# Patient Record
Sex: Female | Born: 1980 | Race: White | Hispanic: No | Marital: Married | Smoking: Never smoker
Health system: Southern US, Community
[De-identification: ages and names within clinical notes are randomized; demographics above are authoritative.]

## PROBLEM LIST (undated history)

## (undated) DIAGNOSIS — F419 Anxiety disorder, unspecified: Secondary | ICD-10-CM

## (undated) DIAGNOSIS — Z789 Other specified health status: Secondary | ICD-10-CM

## (undated) HISTORY — PX: WISDOM TOOTH EXTRACTION: SHX21

## (undated) HISTORY — DX: Anxiety disorder, unspecified: F41.9

## (undated) HISTORY — PX: NO PAST SURGERIES: SHX2092

---

## 2007-09-04 ENCOUNTER — Other Ambulatory Visit: Admission: RE | Admit: 2007-09-04 | Discharge: 2007-09-04 | Payer: Self-pay | Admitting: Obstetrics and Gynecology

## 2008-09-21 ENCOUNTER — Ambulatory Visit (HOSPITAL_COMMUNITY): Admission: RE | Admit: 2008-09-21 | Discharge: 2008-09-21 | Payer: Self-pay | Admitting: Gynecology

## 2009-07-08 ENCOUNTER — Inpatient Hospital Stay (HOSPITAL_COMMUNITY): Admission: AD | Admit: 2009-07-08 | Discharge: 2009-07-08 | Payer: Self-pay | Admitting: Obstetrics and Gynecology

## 2009-08-25 ENCOUNTER — Inpatient Hospital Stay (HOSPITAL_COMMUNITY): Admission: AD | Admit: 2009-08-25 | Discharge: 2009-08-27 | Payer: Self-pay | Admitting: Obstetrics and Gynecology

## 2010-08-01 LAB — CBC
HCT: 27.5 % — ABNORMAL LOW (ref 36.0–46.0)
MCHC: 34.8 g/dL (ref 30.0–36.0)
Platelets: 161 10*3/uL (ref 150–400)

## 2010-08-02 LAB — CBC
HCT: 36.6 % (ref 36.0–46.0)
MCV: 89.1 fL (ref 78.0–100.0)
RBC: 4.11 MIL/uL (ref 3.87–5.11)
WBC: 11.1 10*3/uL — ABNORMAL HIGH (ref 4.0–10.5)

## 2010-08-02 LAB — RPR: RPR Ser Ql: NONREACTIVE

## 2010-08-02 LAB — URINALYSIS, ROUTINE W REFLEX MICROSCOPIC: Bilirubin Urine: NEGATIVE

## 2010-08-02 LAB — URINE MICROSCOPIC-ADD ON

## 2010-09-28 ENCOUNTER — Other Ambulatory Visit (HOSPITAL_COMMUNITY)
Admission: RE | Admit: 2010-09-28 | Discharge: 2010-09-28 | Disposition: A | Discharge status: Home / Self Care | Payer: BC Managed Care – PPO | Source: Ambulatory Visit | Attending: Obstetrics & Gynecology | Admitting: Obstetrics & Gynecology

## 2010-09-28 ENCOUNTER — Other Ambulatory Visit: Payer: Self-pay | Admitting: Obstetrics & Gynecology

## 2010-09-28 DIAGNOSIS — Z01419 Encounter for gynecological examination (general) (routine) without abnormal findings: Secondary | ICD-10-CM | POA: Insufficient documentation

## 2012-01-22 LAB — OB RESULTS CONSOLE HIV ANTIBODY (ROUTINE TESTING): HIV: NONREACTIVE

## 2012-01-22 LAB — OB RESULTS CONSOLE GC/CHLAMYDIA
Chlamydia: NEGATIVE
Gonorrhea: NEGATIVE

## 2012-05-14 NOTE — L&D Delivery Note (Signed)
SVD of VMI at 1152 on 08/27/12 wt and cord pH pending.  EBL 250cc.  APGARs 9,9. Head delivered ROA with mouth and nose bulb suctioned.  Body delivered atraumatically.  Cord clamped, cut and baby to abdomen.  Cord pH obtained.  Placenta delivered S/I/3VC.  Fundus firmed with pitocin and massage.  2nd degree perineal laceration repaired with 3-0 Rapide in the normal fashion.  Mom and baby stable.

## 2012-07-28 LAB — OB RESULTS CONSOLE GBS: GBS: NEGATIVE

## 2012-08-27 ENCOUNTER — Encounter (HOSPITAL_COMMUNITY): Payer: Self-pay

## 2012-08-27 ENCOUNTER — Inpatient Hospital Stay (HOSPITAL_COMMUNITY)
Admission: RE | Admit: 2012-08-27 | Discharge: 2012-08-28 | DRG: 373 | Disposition: A | Discharge status: Home / Self Care | Payer: BC Managed Care – PPO | Source: Ambulatory Visit | Attending: Obstetrics & Gynecology | Admitting: Obstetrics & Gynecology

## 2012-08-27 ENCOUNTER — Inpatient Hospital Stay (HOSPITAL_COMMUNITY): Payer: BC Managed Care – PPO | Admitting: Anesthesiology

## 2012-08-27 ENCOUNTER — Encounter (HOSPITAL_COMMUNITY): Payer: Self-pay | Admitting: Anesthesiology

## 2012-08-27 HISTORY — DX: Other specified health status: Z78.9

## 2012-08-27 LAB — CBC
Hemoglobin: 9.8 g/dL — ABNORMAL LOW (ref 12.0–15.0)
MCH: 24.7 pg — ABNORMAL LOW (ref 26.0–34.0)
MCHC: 32.2 g/dL (ref 30.0–36.0)
Platelets: 201 10*3/uL (ref 150–400)
RDW: 14.2 % (ref 11.5–15.5)

## 2012-08-27 LAB — RPR: RPR Ser Ql: NONREACTIVE

## 2012-08-27 MED ORDER — SIMETHICONE 80 MG PO CHEW: 80.0000 mg | CHEWABLE_TABLET | ORAL | Status: DC | PRN

## 2012-08-27 MED ORDER — OXYTOCIN BOLUS FROM INFUSION: 500.0000 mL | INTRAVENOUS | Status: DC

## 2012-08-27 MED ORDER — PRENATAL MULTIVITAMIN CH
1.0000 | ORAL_TABLET | Freq: Every day | ORAL | Status: DC
  Administered 2012-08-27: 1 via ORAL
  Filled 2012-08-27 (×2): qty 1

## 2012-08-27 MED ORDER — LACTATED RINGERS IV SOLN: 500.0000 mL | INTRAVENOUS | Status: DC | PRN

## 2012-08-27 MED ORDER — SENNOSIDES-DOCUSATE SODIUM 8.6-50 MG PO TABS
2.0000 | ORAL_TABLET | Freq: Every day | ORAL | Status: DC
  Administered 2012-08-27: 2 via ORAL

## 2012-08-27 MED ORDER — ZOLPIDEM TARTRATE 5 MG PO TABS: 5.0000 mg | ORAL_TABLET | Freq: Every evening | ORAL | Status: DC | PRN

## 2012-08-27 MED ORDER — LANOLIN HYDROUS EX OINT: TOPICAL_OINTMENT | CUTANEOUS | Status: DC | PRN

## 2012-08-27 MED ORDER — DIPHENHYDRAMINE HCL 50 MG/ML IJ SOLN: 12.5000 mg | INTRAMUSCULAR | Status: DC | PRN

## 2012-08-27 MED ORDER — ONDANSETRON HCL 4 MG PO TABS: 4.0000 mg | ORAL_TABLET | ORAL | Status: DC | PRN

## 2012-08-27 MED ORDER — LIDOCAINE HCL (PF) 1 % IJ SOLN
INTRAMUSCULAR | Status: DC | PRN
  Administered 2012-08-27 (×2): 4 mL

## 2012-08-27 MED ORDER — FENTANYL 2.5 MCG/ML BUPIVACAINE 1/10 % EPIDURAL INFUSION (WH - ANES)
14.0000 mL/h | INTRAMUSCULAR | Status: DC | PRN
  Filled 2012-08-27: qty 125

## 2012-08-27 MED ORDER — DIPHENHYDRAMINE HCL 25 MG PO CAPS: 25.0000 mg | ORAL_CAPSULE | Freq: Four times a day (QID) | ORAL | Status: DC | PRN

## 2012-08-27 MED ORDER — CITRIC ACID-SODIUM CITRATE 334-500 MG/5ML PO SOLN: 30.0000 mL | ORAL | Status: DC | PRN

## 2012-08-27 MED ORDER — DIBUCAINE 1 % RE OINT
1.0000 "application " | TOPICAL_OINTMENT | RECTAL | Status: DC | PRN
  Filled 2012-08-27: qty 28

## 2012-08-27 MED ORDER — EPHEDRINE 5 MG/ML INJ
10.0000 mg | INTRAVENOUS | Status: DC | PRN
  Filled 2012-08-27: qty 2

## 2012-08-27 MED ORDER — OXYCODONE-ACETAMINOPHEN 5-325 MG PO TABS: 1.0000 | ORAL_TABLET | ORAL | Status: DC | PRN

## 2012-08-27 MED ORDER — FLEET ENEMA 7-19 GM/118ML RE ENEM: 1.0000 | ENEMA | RECTAL | Status: DC | PRN

## 2012-08-27 MED ORDER — ACETAMINOPHEN 325 MG PO TABS: 650.0000 mg | ORAL_TABLET | ORAL | Status: DC | PRN

## 2012-08-27 MED ORDER — WITCH HAZEL-GLYCERIN EX PADS: 1.0000 "application " | MEDICATED_PAD | CUTANEOUS | Status: DC | PRN

## 2012-08-27 MED ORDER — FENTANYL 2.5 MCG/ML BUPIVACAINE 1/10 % EPIDURAL INFUSION (WH - ANES)
INTRAMUSCULAR | Status: DC | PRN
  Administered 2012-08-27: 14 mL/h via EPIDURAL

## 2012-08-27 MED ORDER — PHENYLEPHRINE 40 MCG/ML (10ML) SYRINGE FOR IV PUSH (FOR BLOOD PRESSURE SUPPORT)
80.0000 ug | PREFILLED_SYRINGE | INTRAVENOUS | Status: DC | PRN
  Filled 2012-08-27: qty 2
  Filled 2012-08-27: qty 5

## 2012-08-27 MED ORDER — EPHEDRINE 5 MG/ML INJ
10.0000 mg | INTRAVENOUS | Status: DC | PRN
  Filled 2012-08-27: qty 2
  Filled 2012-08-27: qty 4

## 2012-08-27 MED ORDER — BENZOCAINE-MENTHOL 20-0.5 % EX AERO
1.0000 "application " | INHALATION_SPRAY | CUTANEOUS | Status: DC | PRN
  Administered 2012-08-28: 1 via TOPICAL
  Filled 2012-08-27 (×2): qty 56

## 2012-08-27 MED ORDER — PHENYLEPHRINE 40 MCG/ML (10ML) SYRINGE FOR IV PUSH (FOR BLOOD PRESSURE SUPPORT)
80.0000 ug | PREFILLED_SYRINGE | INTRAVENOUS | Status: DC | PRN
  Filled 2012-08-27: qty 2

## 2012-08-27 MED ORDER — ONDANSETRON HCL 4 MG/2ML IJ SOLN: 4.0000 mg | Freq: Four times a day (QID) | INTRAMUSCULAR | Status: DC | PRN

## 2012-08-27 MED ORDER — OXYTOCIN 40 UNITS IN LACTATED RINGERS INFUSION - SIMPLE MED: 62.5000 mL/h | INTRAVENOUS | Status: DC

## 2012-08-27 MED ORDER — IBUPROFEN 600 MG PO TABS
600.0000 mg | ORAL_TABLET | Freq: Four times a day (QID) | ORAL | Status: DC
  Administered 2012-08-27 – 2012-08-28 (×4): 600 mg via ORAL
  Filled 2012-08-27 (×3): qty 1

## 2012-08-27 MED ORDER — LIDOCAINE HCL (PF) 1 % IJ SOLN
30.0000 mL | INTRAMUSCULAR | Status: DC | PRN
  Filled 2012-08-27: qty 30

## 2012-08-27 MED ORDER — TERBUTALINE SULFATE 1 MG/ML IJ SOLN: 0.2500 mg | Freq: Once | INTRAMUSCULAR | Status: DC | PRN

## 2012-08-27 MED ORDER — OXYCODONE-ACETAMINOPHEN 5-325 MG PO TABS
1.0000 | ORAL_TABLET | ORAL | Status: DC | PRN
  Administered 2012-08-28: 1 via ORAL
  Filled 2012-08-27: qty 1

## 2012-08-27 MED ORDER — LACTATED RINGERS IV SOLN
INTRAVENOUS | Status: DC
  Administered 2012-08-27 (×2): via INTRAVENOUS

## 2012-08-27 MED ORDER — ONDANSETRON HCL 4 MG/2ML IJ SOLN: 4.0000 mg | INTRAMUSCULAR | Status: DC | PRN

## 2012-08-27 MED ORDER — OXYTOCIN 40 UNITS IN LACTATED RINGERS INFUSION - SIMPLE MED
1.0000 m[IU]/min | INTRAVENOUS | Status: DC
  Administered 2012-08-27: 2 m[IU]/min via INTRAVENOUS
  Filled 2012-08-27: qty 1000

## 2012-08-27 MED ORDER — IBUPROFEN 600 MG PO TABS
600.0000 mg | ORAL_TABLET | Freq: Four times a day (QID) | ORAL | Status: DC | PRN
  Filled 2012-08-27: qty 1

## 2012-08-27 MED ORDER — TETANUS-DIPHTH-ACELL PERTUSSIS 5-2.5-18.5 LF-MCG/0.5 IM SUSP
0.5000 mL | Freq: Once | INTRAMUSCULAR | Status: DC
  Filled 2012-08-27: qty 0.5

## 2012-08-27 MED ORDER — LACTATED RINGERS IV SOLN
500.0000 mL | Freq: Once | INTRAVENOUS | Status: AC
  Administered 2012-08-27: 500 mL via INTRAVENOUS

## 2012-08-27 NOTE — H&P (Signed)
Vanessa Bryant is a 32 y.o. female presenting for induction of labor.  GBS negative.  No antepartum complications. Maternal Medical History:  Contractions: Onset was 1 week ago.   Frequency: rare.   Perceived severity is mild.    Fetal activity: Perceived fetal activity is normal.   Last perceived fetal movement was within the past hour.    Prenatal complications: no prenatal complications Prenatal Complications - Diabetes: none.    OB History   Grav Para Term Preterm Abortions TAB SAB Ect Mult Living   2 1 1  0 0 0 0   1     Past Medical History  Diagnosis Date  . Medical history non-contributory    Past Surgical History  Procedure Laterality Date  . No past surgeries     Family History: family history includes Diabetes in her maternal grandfather and maternal grandmother; Heart disease in her mother; and Hypertension in her father and mother. Social History:  reports that she has never smoked. She has never used smokeless tobacco. She reports that she does not drink alcohol or use illicit drugs.   Prenatal Transfer Tool  Maternal Diabetes: No Genetic Screening: Normal Maternal Ultrasounds/Referrals: Normal Fetal Ultrasounds or other Referrals:  None Maternal Substance Abuse:  No Significant Maternal Medications:  None Significant Maternal Lab Results:  None Other Comments:  None  ROS    Blood pressure 117/80, pulse 70, temperature 98.1 F (36.7 C), temperature source Oral, resp. rate 18, height 5\' 2"  (1.575 m), weight 150 lb (68.04 kg). Maternal Exam:  Uterine Assessment: Contraction strength is mild.  Contraction frequency is rare.   Abdomen: Patient reports no abdominal tenderness. Fundal height is c/w dates.   Estimated fetal weight is 7#8.   Fetal presentation: vertex  Introitus: Normal vulva. Normal vagina.  Amniotic fluid character: clear.  Pelvis: adequate for delivery.   Cervix: Cervix evaluated by digital exam.     Physical Exam  Constitutional:  She is oriented to person, place, and time. She appears well-developed and well-nourished.  GI: Soft. There is no rebound and no guarding.  Genitourinary: Vagina normal and uterus normal.  Neurological: She is alert and oriented to person, place, and time.  Skin: Skin is warm and dry.  Psychiatric: She has a normal mood and affect. Her behavior is normal.    Prenatal labs: ABO, Rh: O/Positive/-- (09/10 0000) Antibody: Negative (09/10 0000) Rubella: Immune (09/10 0000) RPR: Nonreactive (09/10 0000)  HBsAg: Negative (09/10 0000)  HIV: Non-reactive (09/10 0000)  GBS: Negative (03/17 0000)   Assessment/Plan: 31yo G2P1001 at [redacted]w[redacted]d for IOL -AROM with pitocin as needed -Epidural if desired -Anticipate SVD   Vinisha Faxon 08/27/2012, 7:37 AM

## 2012-08-27 NOTE — Anesthesia Procedure Notes (Signed)
Epidural Patient location during procedure: OB Start time: 08/27/2012 8:59 AM  Staffing Anesthesiologist: Janea Schwenn A. Performed by: anesthesiologist   Preanesthetic Checklist Completed: patient identified, site marked, surgical consent, pre-op evaluation, timeout performed, IV checked, risks and benefits discussed and monitors and equipment checked  Epidural Patient position: sitting Prep: site prepped and draped and DuraPrep Patient monitoring: continuous pulse ox and blood pressure Approach: midline Injection technique: LOR air  Needle:  Needle type: Tuohy  Needle gauge: 17 G Needle length: 9 cm and 9 Needle insertion depth: 4 cm Catheter type: closed end flexible Catheter size: 19 Gauge Catheter at skin depth: 9 cm Test dose: negative and Other  Assessment Events: blood not aspirated, injection not painful, no injection resistance, negative IV test and no paresthesia  Additional Notes Patient identified. Risks and benefits discussed including failed block, incomplete  Pain control, post dural puncture headache, nerve damage, paralysis, blood pressure Changes, nausea, vomiting, reactions to medications-both toxic and allergic and post Partum back pain. All questions were answered. Patient expressed understanding and wished to proceed. Sterile technique was used throughout procedure. Epidural site was Dressed with sterile barrier dressing. No paresthesias, signs of intravascular injection Or signs of intrathecal spread were encountered.  Patient was more comfortable after the epidural was dosed. Please see RN's note for documentation of vital signs and FHR which are stable.

## 2012-08-27 NOTE — Anesthesia Preprocedure Evaluation (Signed)

## 2012-08-28 LAB — CBC
Platelets: 172 10*3/uL (ref 150–400)
RBC: 3.71 MIL/uL — ABNORMAL LOW (ref 3.87–5.11)
RDW: 14.3 % (ref 11.5–15.5)
WBC: 12.2 10*3/uL — ABNORMAL HIGH (ref 4.0–10.5)

## 2012-08-28 MED ORDER — IBUPROFEN 600 MG PO TABS: 600.0000 mg | ORAL_TABLET | Freq: Four times a day (QID) | ORAL | Status: DC

## 2012-08-28 MED ORDER — OXYCODONE-ACETAMINOPHEN 5-325 MG PO TABS: 1.0000 | ORAL_TABLET | ORAL | Status: DC | PRN

## 2012-08-28 NOTE — Anesthesia Postprocedure Evaluation (Signed)
  Anesthesia Post-op Note  Patient: Vanessa Bryant  Procedure(s) Performed: * No procedures listed *  Patient Location: Mother/Baby  Anesthesia Type:Epidural  Level of Consciousness: awake  Airway and Oxygen Therapy: Patient Spontanous Breathing  Post-op Pain: none  Post-op Assessment: Patient's Cardiovascular Status Stable, Respiratory Function Stable, Patent Airway, No signs of Nausea or vomiting, Adequate PO intake, Pain level controlled, No headache, No backache, No residual numbness and No residual motor weakness  Post-op Vital Signs: Reviewed and stable  Complications: No apparent anesthesia complications

## 2012-08-28 NOTE — Progress Notes (Signed)
UR chart review completed.  

## 2012-08-28 NOTE — Progress Notes (Signed)
Post Partum Day 1 Subjective: no complaints, up ad lib, voiding, tolerating PO and + flatus  Objective: Blood pressure 126/81, pulse 65, temperature 97.6 F (36.4 C), temperature source Oral, resp. rate 18, height 5\' 2"  (1.575 m), weight 150 lb (68.04 kg), SpO2 99.00%, unknown if currently breastfeeding.  Physical Exam:  General: alert and cooperative Lochia: appropriate Uterine Fundus: firm Incision: perineum intact, small labial edema DVT Evaluation: No evidence of DVT seen on physical exam. Negative Homan's sign. No cords or calf tenderness. No significant calf/ankle edema.   Recent Labs  08/27/12 0700 08/28/12 0644  HGB 9.8* 9.0*  HCT 30.4* 28.7*    Assessment/Plan: Discharge home and Circumcision prior to discharge   LOS: 1 day   Kenneth Cuaresma G 08/28/2012, 8:01 AM

## 2012-08-28 NOTE — Discharge Summary (Signed)
Obstetric Discharge Summary Reason for Admission: induction of labor Prenatal Procedures: ultrasound Intrapartum Procedures: spontaneous vaginal delivery Postpartum Procedures: none Complications-Operative and Postpartum: 2 degree perineal laceration Hemoglobin  Date Value Range Status  08/28/2012 9.0* 12.0 - 15.0 g/dL Final     HCT  Date Value Range Status  08/28/2012 28.7* 36.0 - 46.0 % Final    Physical Exam:  General: alert and cooperative Lochia: appropriate Uterine Fundus: firm Incision: perineum intact, small labial edema DVT Evaluation: No evidence of DVT seen on physical exam. Negative Homan's sign. No cords or calf tenderness. No significant calf/ankle edema.  Discharge Diagnoses: Term Pregnancy-delivered  Discharge Information: Date: 08/28/2012 Activity: pelvic rest Diet: routine Medications: PNV, Ibuprofen and Percocet Condition: stable Instructions: refer to practice specific booklet Discharge to: home   Newborn Data: Live born female  Birth Weight: 7 lb 13.9 oz (3569 g) APGAR: 9, 9  Home with mother.  CURTIS,CAROL G 08/28/2012, 8:16 AM

## 2012-08-29 ENCOUNTER — Inpatient Hospital Stay (HOSPITAL_COMMUNITY): Admission: AD | Admit: 2012-08-29 | Payer: Self-pay | Source: Ambulatory Visit | Admitting: Obstetrics and Gynecology

## 2012-11-30 ENCOUNTER — Emergency Department (INDEPENDENT_AMBULATORY_CARE_PROVIDER_SITE_OTHER)
Admission: EM | Admit: 2012-11-30 | Discharge: 2012-11-30 | Disposition: A | Discharge status: Home / Self Care | Payer: BC Managed Care – PPO | Source: Home / Self Care

## 2012-11-30 ENCOUNTER — Encounter (HOSPITAL_COMMUNITY): Payer: Self-pay

## 2012-11-30 DIAGNOSIS — R109 Unspecified abdominal pain: Secondary | ICD-10-CM

## 2012-11-30 LAB — POCT URINALYSIS DIP (DEVICE)
Bilirubin Urine: NEGATIVE
Glucose, UA: NEGATIVE mg/dL
Hgb urine dipstick: NEGATIVE
Leukocytes, UA: NEGATIVE
Nitrite: NEGATIVE
Urobilinogen, UA: 0.2 mg/dL (ref 0.0–1.0)

## 2012-11-30 LAB — POCT PREGNANCY, URINE: Preg Test, Ur: NEGATIVE

## 2012-11-30 NOTE — ED Notes (Signed)
Right side flank pain 

## 2012-11-30 NOTE — ED Provider Notes (Signed)
CANDI PROFIT is a 32 y.o. female who presents to Urgent Care today for right flank pain. Patient has had intermittent episodes of acutely intense right flank pain for the last 13 weeks. She delivered a healthy baby 13 weeks ago and a few days after the delivery she had one episode of right flank pain that lasted several hours. Since then she has had several episodes of moderate intensity short duration right flank pain. She had a new episode of right flank pain last night. This was moderate to severely intense and lasted several hours. It did not radiate nor was it associated with vomiting or diarrhea. The pain resolved last night and has not recurred.  She is currently pain-free. She denies any fevers or chills and feels well otherwise. She took ibuprofen for the pain which worked reasonably well. She denies any blood in her urine.   She is currently breast-feeding and allergic to penicillin.    PMH reviewed. As above History  Substance Use Topics  . Smoking status: Never Smoker   . Smokeless tobacco: Never Used  . Alcohol Use: No   ROS as above Medications reviewed. No current facility-administered medications for this encounter.   Current Outpatient Prescriptions  Medication Sig Dispense Refill  . ibuprofen (ADVIL,MOTRIN) 600 MG tablet Take 1 tablet (600 mg total) by mouth every 6 (six) hours.  30 tablet  1  . Prenatal Vit-Fe Fumarate-FA (PRENATAL MULTIVITAMIN) TABS Take 1 tablet by mouth daily at 12 noon.        Exam:  BP 148/87  Pulse 70  Temp(Src) 98.1 F (36.7 C) (Oral)  Resp 16  SpO2 99% Gen: Well NAD HEENT: EOMI,  MMM Lungs: CTABL Nl WOB Heart: RRR no MRG Abd: NABS, NT, ND,  No costovertebral angle tenderness to percussion.  Exts: Non edematous BL  LE, warm and well perfused.   Results for orders placed during the hospital encounter of 11/30/12 (from the past 24 hour(s))  POCT URINALYSIS DIP (DEVICE)     Status: Abnormal   Collection Time    11/30/12 11:28 AM   Result Value Range   Glucose, UA NEGATIVE  NEGATIVE mg/dL   Bilirubin Urine NEGATIVE  NEGATIVE   Ketones, ur NEGATIVE  NEGATIVE mg/dL   Specific Gravity, Urine 1.020  1.005 - 1.030   Hgb urine dipstick NEGATIVE  NEGATIVE   pH 7.5  5.0 - 8.0   Protein, ur 30 (*) NEGATIVE mg/dL   Urobilinogen, UA 0.2  0.0 - 1.0 mg/dL   Nitrite NEGATIVE  NEGATIVE   Leukocytes, UA NEGATIVE  NEGATIVE  POCT PREGNANCY, URINE     Status: None   Collection Time    11/30/12 11:30 AM      Result Value Range   Preg Test, Ur NEGATIVE  NEGATIVE   No results found.  Assessment and Plan: 32 y.o. female with right flank pain.  Symptoms consistent with passed kidney stone.  Urine only shows small amount of protein.  Feel it is reasonable for her to followup with her primary care Dr. to work this up further.  I feel that a OB/GYN ultrasound would be reasonable.  Discussed precautions with patient who expresses understand.  Discussed warning signs or symptoms. Please see discharge instructions.     Rodolph Bong, MD 11/30/12 [phone]

## 2012-12-02 ENCOUNTER — Ambulatory Visit (INDEPENDENT_AMBULATORY_CARE_PROVIDER_SITE_OTHER): Payer: BC Managed Care – PPO | Admitting: Obstetrics & Gynecology

## 2012-12-02 ENCOUNTER — Encounter: Payer: Self-pay | Admitting: Obstetrics & Gynecology

## 2012-12-02 VITALS — BP 132/80 | Ht 62.0 in | Wt 119.8 lb

## 2012-12-02 DIAGNOSIS — N201 Calculus of ureter: Secondary | ICD-10-CM | POA: Insufficient documentation

## 2012-12-02 MED ORDER — PROMETHAZINE HCL 25 MG PO TABS: 25.0000 mg | ORAL_TABLET | Freq: Four times a day (QID) | ORAL | Status: DC | PRN

## 2012-12-02 NOTE — Progress Notes (Signed)
Patient ID: Vanessa Bryant, female   DOB: 11-02-1980, 32 y.o.   MRN: [id number] Patient had clinically the passage of a kidney stone last Saturday night Feels fine now Recommended to drink 8 oz unsweetened black cherry juice a day and to use filtered/bottled water  No reason to do scan at this point Pt agrees

## 2012-12-02 NOTE — Patient Instructions (Addendum)

## 2012-12-16 ENCOUNTER — Telehealth: Payer: Self-pay | Admitting: Obstetrics & Gynecology

## 2012-12-16 DIAGNOSIS — N201 Calculus of ureter: Secondary | ICD-10-CM

## 2012-12-16 NOTE — Telephone Encounter (Signed)
Pt informed CT scan ordered, pt to call [phone] to schedule time at her convenience. Pt verbalized understanding.

## 2012-12-16 NOTE — Telephone Encounter (Signed)
Pt states up last night due to right mid-back pain, took ibuprofen and oxycodone with some improvement. Pt states saw Dr. Despina Hidden x 2 weeks ago and would like to proceed with a scan to see "what is going on." States questionable kidney stone.

## 2012-12-19 ENCOUNTER — Ambulatory Visit (HOSPITAL_COMMUNITY)
Admission: RE | Admit: 2012-12-19 | Discharge: 2012-12-19 | Disposition: A | Discharge status: Home / Self Care | Payer: BC Managed Care – PPO | Source: Ambulatory Visit | Attending: Obstetrics & Gynecology | Admitting: Obstetrics & Gynecology

## 2012-12-19 DIAGNOSIS — K8020 Calculus of gallbladder without cholecystitis without obstruction: Secondary | ICD-10-CM | POA: Insufficient documentation

## 2012-12-19 DIAGNOSIS — R1031 Right lower quadrant pain: Secondary | ICD-10-CM | POA: Insufficient documentation

## 2012-12-19 DIAGNOSIS — N201 Calculus of ureter: Secondary | ICD-10-CM

## 2012-12-19 NOTE — Discharge Instructions (Signed)
Neg POCT pregnancy test done by Chapman Moss, RN  Done per Dr Rayna Sexton orders via phone order/ [facility]

## 2013-01-13 ENCOUNTER — Telehealth: Payer: Self-pay | Admitting: Obstetrics & Gynecology

## 2013-01-13 ENCOUNTER — Other Ambulatory Visit: Payer: Self-pay | Admitting: *Deleted

## 2013-01-13 DIAGNOSIS — K8020 Calculus of gallbladder without cholecystitis without obstruction: Secondary | ICD-10-CM

## 2013-01-13 NOTE — Telephone Encounter (Signed)
Please call and set up referral for pt to the listed physician

## 2013-01-13 NOTE — Telephone Encounter (Signed)
Appt made with Avel Peace, [facility] surgery per Dr. Despina Hidden. Pt aware.

## 2013-01-13 NOTE — Telephone Encounter (Signed)
Pt continues to have gallbladder issues would like referral to [facility], Dr. Avel Peace.

## 2013-01-22 ENCOUNTER — Encounter (INDEPENDENT_AMBULATORY_CARE_PROVIDER_SITE_OTHER): Payer: Self-pay | Admitting: General Surgery

## 2013-01-22 ENCOUNTER — Ambulatory Visit (INDEPENDENT_AMBULATORY_CARE_PROVIDER_SITE_OTHER): Payer: BC Managed Care – PPO | Admitting: General Surgery

## 2013-01-22 VITALS — BP 100/60 | HR 64 | Resp 16 | Ht 62.0 in | Wt 116.4 lb

## 2013-01-22 DIAGNOSIS — K8020 Calculus of gallbladder without cholecystitis without obstruction: Secondary | ICD-10-CM

## 2013-01-22 NOTE — Progress Notes (Signed)
Patient ID: Vanessa Bryant, female   DOB: [birth year], 32 y.o.   MRN: [id number]  No chief complaint on file.   HPI Vanessa Bryant is a 32 y.o. female.  The patient is a 32 year old female has been diagnosed with gallstones. The patient states she began having right upper quadrant pain approximately 5 months ago after the birth of her second child. She states that all her pain is becoming more frequent now to approximately once a week. She has been staying along the fatty foods, but continues with pain. She does feel that the amount of food she's also contributes to the pain.  HPI  Past Medical History  Diagnosis Date  . Medical history non-contributory     Past Surgical History  Procedure Laterality Date  . No past surgeries      Family History  Problem Relation Age of Onset  . Heart disease Mother   . Hypertension Mother   . Hypertension Father   . Diabetes Maternal Grandmother   . Diabetes Maternal Grandfather     Social History History  Substance Use Topics  . Smoking status: Never Smoker   . Smokeless tobacco: Never Used  . Alcohol Use: No    Allergies  Allergen Reactions  . Penicillins Rash    As a child     Current Outpatient Prescriptions  Medication Sig Dispense Refill  . HEATHER 0.35 MG tablet Take 1 tablet by mouth daily.       Marland Kitchen ibuprofen (ADVIL,MOTRIN) 600 MG tablet Take 1 tablet (600 mg total) by mouth every 6 (six) hours.  30 tablet  1  . oxyCODONE-acetaminophen (PERCOCET/ROXICET) 5-325 MG per tablet Take 1 tablet by mouth every 4 (four) hours as needed for pain.      . Prenatal Vit-Fe Fumarate-FA (PRENATAL MULTIVITAMIN) TABS Take 1 tablet by mouth daily at 12 noon.      . promethazine (PHENERGAN) 25 MG tablet Take 1 tablet (25 mg total) by mouth every 6 (six) hours as needed for nausea.  30 tablet  1   No current facility-administered medications for this visit.    Review of Systems Review of Systems  HENT: Negative.   Respiratory: Negative.    Cardiovascular: Negative.   Gastrointestinal: Positive for abdominal pain.  Musculoskeletal: Negative.   Neurological: Negative.   All other systems reviewed and are negative.    Blood pressure 100/60, pulse 64, resp. rate 16, height 5\' 2"  (1.575 m), weight 116 lb 6.4 oz (52.799 kg).  Physical Exam Physical Exam  Constitutional: She is oriented to person, place, and time. She appears well-developed.  HENT:  Head: Normocephalic and atraumatic.  Eyes: Conjunctivae and EOM are normal. Pupils are equal, round, and reactive to light.  Neck: Normal range of motion. Neck supple.  Cardiovascular: Normal rate, regular rhythm and normal heart sounds.   Pulmonary/Chest: Effort normal and breath sounds normal.  Abdominal: Soft. Bowel sounds are normal. She exhibits no distension. There is tenderness (min to RUQ). There is no rebound and no guarding.  Musculoskeletal: Normal range of motion.  Neurological: She is alert and oriented to person, place, and time.  Skin: Skin is warm and dry.    Data Reviewed CT scan reveals large gallstone within the gallbladder.  Assessment    32 year old female with symptomatic cholelithiasis     Plan     1. We will proceed to the operating room for laparoscopic cholecystectomy with IOC.  2. All risks and benefits were discussed with the patient to  generally include: infection, bleeding, possible need for post op ERCP, damage to the bile ducts, and bile leak. Alternatives were offered and described.  All questions were answered and the patient voiced understanding of the procedure and wishes to proceed at this point with a laparoscopic cholecystectomy        Marigene Ehlers., [facility] 01/22/2013, 10:37 AM

## 2013-01-29 ENCOUNTER — Other Ambulatory Visit (INDEPENDENT_AMBULATORY_CARE_PROVIDER_SITE_OTHER): Payer: Self-pay | Admitting: General Surgery

## 2013-01-29 ENCOUNTER — Telehealth (INDEPENDENT_AMBULATORY_CARE_PROVIDER_SITE_OTHER): Payer: Self-pay

## 2013-01-29 NOTE — Telephone Encounter (Signed)
Sherry at [facility] pre admit requests surgery orders be put into epic on pt. I advised her request will be sent to Dr Derrell Lolling today.

## 2013-01-30 ENCOUNTER — Ambulatory Visit (INDEPENDENT_AMBULATORY_CARE_PROVIDER_SITE_OTHER): Payer: BC Managed Care – PPO | Admitting: General Surgery

## 2013-02-03 ENCOUNTER — Encounter (HOSPITAL_COMMUNITY): Payer: Self-pay | Admitting: Pharmacy Technician

## 2013-02-04 NOTE — Pre-Procedure Instructions (Signed)
Vanessa Bryant  02/04/2013   Your procedure is scheduled on:   Monday  02/16/13   Report to [facility]  2 * 3 at 530 AM.  Call this number if you have problems the morning of surgery: [phone]   Remember:   Do not eat food or drink liquids after midnight.   Take these medicines the morning of surgery with A SIP OF WATER:  OXYCODONE(PERCOCET)   Do not wear jewelry, make-up or nail polish.  Do not wear lotions, powders, or perfumes. You may wear deodorant.  Do not shave 48 hours prior to surgery. Men may shave face and neck.  Do not bring valuables to the hospital.  [facility] is not responsible                  for any belongings or valuables.               Contacts, dentures or bridgework may not be worn into surgery.  Leave suitcase in the car. After surgery it may be brought to your room.  For patients admitted to the hospital, discharge time is determined by your                treatment team.               Patients discharged the day of surgery will not be allowed to drive  home.  Name and phone number of your driver:  Special Instructions: Shower using CHG 2 nights before surgery and the night before surgery.  If you shower the day of surgery use CHG.  Use special wash - you have one bottle of CHG for all showers.  You should use approximately 1/3 of the bottle for each shower.   Please read over the following fact sheets that you were given: Pain Booklet, Coughing and Deep Breathing and Surgical Site Infection Prevention

## 2013-02-05 ENCOUNTER — Encounter (HOSPITAL_COMMUNITY): Payer: Self-pay

## 2013-02-05 ENCOUNTER — Encounter (HOSPITAL_COMMUNITY)
Admission: RE | Admit: 2013-02-05 | Discharge: 2013-02-05 | Disposition: A | Discharge status: Home / Self Care | Payer: BC Managed Care – PPO | Source: Ambulatory Visit | Attending: General Surgery | Admitting: General Surgery

## 2013-02-05 DIAGNOSIS — Z01818 Encounter for other preprocedural examination: Secondary | ICD-10-CM | POA: Insufficient documentation

## 2013-02-05 DIAGNOSIS — Z01812 Encounter for preprocedural laboratory examination: Secondary | ICD-10-CM | POA: Insufficient documentation

## 2013-02-05 LAB — CBC
MCV: 80.2 fL (ref 78.0–100.0)
Platelets: 187 10*3/uL (ref 150–400)
RBC: 4.89 MIL/uL (ref 3.87–5.11)
RDW: 14 % (ref 11.5–15.5)
WBC: 4.9 10*3/uL (ref 4.0–10.5)

## 2013-02-05 LAB — BASIC METABOLIC PANEL
Chloride: 103 mEq/L (ref 96–112)
Creatinine, Ser: 0.77 mg/dL (ref 0.50–1.10)
GFR calc Af Amer: >90 mL/min (ref >90)
Sodium: 139 mEq/L (ref 135–145)

## 2013-02-05 LAB — HCG, SERUM, QUALITATIVE: Preg, Serum: NEGATIVE

## 2013-02-05 NOTE — Progress Notes (Signed)
PCP is Dr Assunta Found  Denies having an echo, stress test, card cath, recent EKG, or CXR. Pt is breast feeding her 5 month old son, and has not had a period since last year.

## 2013-02-15 MED ORDER — CIPROFLOXACIN IN D5W 400 MG/200ML IV SOLN
400.0000 mg | INTRAVENOUS | Status: AC
  Administered 2013-02-16: 400 mg via INTRAVENOUS
  Filled 2013-02-15: qty 200

## 2013-02-16 ENCOUNTER — Ambulatory Visit (HOSPITAL_COMMUNITY)
Admission: RE | Admit: 2013-02-16 | Discharge: 2013-02-16 | Disposition: A | Discharge status: Home / Self Care | Payer: BC Managed Care – PPO | Source: Ambulatory Visit | Attending: General Surgery | Admitting: General Surgery

## 2013-02-16 ENCOUNTER — Ambulatory Visit (HOSPITAL_COMMUNITY): Payer: BC Managed Care – PPO | Admitting: Anesthesiology

## 2013-02-16 ENCOUNTER — Encounter (HOSPITAL_COMMUNITY): Payer: Self-pay | Admitting: Anesthesiology

## 2013-02-16 ENCOUNTER — Encounter (HOSPITAL_COMMUNITY): Payer: Self-pay | Admitting: *Deleted

## 2013-02-16 ENCOUNTER — Ambulatory Visit (HOSPITAL_COMMUNITY): Payer: BC Managed Care – PPO

## 2013-02-16 ENCOUNTER — Encounter (HOSPITAL_COMMUNITY)
Admission: RE | Disposition: A | Discharge status: Home / Self Care | Payer: Self-pay | Source: Ambulatory Visit | Attending: General Surgery

## 2013-02-16 DIAGNOSIS — K8010 Calculus of gallbladder with chronic cholecystitis without obstruction: Secondary | ICD-10-CM

## 2013-02-16 HISTORY — PX: CHOLECYSTECTOMY: SHX55

## 2013-02-16 SURGERY — LAPAROSCOPIC CHOLECYSTECTOMY WITH INTRAOPERATIVE CHOLANGIOGRAM
Anesthesia: General | Site: Abdomen | Wound class: Clean Contaminated

## 2013-02-16 MED ORDER — LACTATED RINGERS IV SOLN
INTRAVENOUS | Status: DC | PRN
  Administered 2013-02-16 (×2): via INTRAVENOUS

## 2013-02-16 MED ORDER — FENTANYL CITRATE 0.05 MG/ML IJ SOLN
INTRAMUSCULAR | Status: DC | PRN
  Administered 2013-02-16 (×3): 50 ug via INTRAVENOUS
  Administered 2013-02-16: 100 ug via INTRAVENOUS

## 2013-02-16 MED ORDER — OXYCODONE HCL 5 MG/5ML PO SOLN: 5.0000 mg | Freq: Once | ORAL | Status: DC | PRN

## 2013-02-16 MED ORDER — ACETAMINOPHEN 650 MG RE SUPP
650.0000 mg | RECTAL | Status: DC | PRN
  Filled 2013-02-16: qty 1

## 2013-02-16 MED ORDER — ONDANSETRON HCL 4 MG/2ML IJ SOLN
4.0000 mg | Freq: Four times a day (QID) | INTRAMUSCULAR | Status: DC | PRN
  Administered 2013-02-16: 4 mg via INTRAVENOUS

## 2013-02-16 MED ORDER — ACETAMINOPHEN 325 MG PO TABS
650.0000 mg | ORAL_TABLET | ORAL | Status: DC | PRN
  Filled 2013-02-16: qty 2

## 2013-02-16 MED ORDER — HYDROMORPHONE HCL PF 1 MG/ML IJ SOLN
INTRAMUSCULAR | Status: AC
  Filled 2013-02-16: qty 1

## 2013-02-16 MED ORDER — GLYCOPYRROLATE 0.2 MG/ML IJ SOLN
INTRAMUSCULAR | Status: DC | PRN
  Administered 2013-02-16: 0.4 mg via INTRAVENOUS

## 2013-02-16 MED ORDER — MIDAZOLAM HCL 2 MG/2ML IJ SOLN: 0.5000 mg | Freq: Once | INTRAMUSCULAR | Status: DC | PRN

## 2013-02-16 MED ORDER — ROCURONIUM BROMIDE 100 MG/10ML IV SOLN
INTRAVENOUS | Status: DC | PRN
  Administered 2013-02-16: 30 mg via INTRAVENOUS

## 2013-02-16 MED ORDER — ONDANSETRON HCL 4 MG/2ML IJ SOLN
INTRAMUSCULAR | Status: AC
  Administered 2013-02-16: 4 mg via INTRAVENOUS
  Filled 2013-02-16: qty 2

## 2013-02-16 MED ORDER — SODIUM CHLORIDE 0.9 % IV SOLN: 250.0000 mL | INTRAVENOUS | Status: DC | PRN

## 2013-02-16 MED ORDER — OXYCODONE HCL 5 MG PO TABS
5.0000 mg | ORAL_TABLET | Freq: Once | ORAL | Status: DC | PRN
  Filled 2013-02-16: qty 1

## 2013-02-16 MED ORDER — ONDANSETRON HCL 4 MG/2ML IJ SOLN
INTRAMUSCULAR | Status: DC | PRN
  Administered 2013-02-16: 4 mg via INTRAVENOUS

## 2013-02-16 MED ORDER — DEXAMETHASONE SODIUM PHOSPHATE 4 MG/ML IJ SOLN
INTRAMUSCULAR | Status: DC | PRN
  Administered 2013-02-16: 4 mg via INTRAVENOUS

## 2013-02-16 MED ORDER — HYDROMORPHONE HCL PF 1 MG/ML IJ SOLN
0.2500 mg | INTRAMUSCULAR | Status: DC | PRN
  Administered 2013-02-16 (×2): 0.5 mg via INTRAVENOUS

## 2013-02-16 MED ORDER — SODIUM CHLORIDE 0.9 % IJ SOLN: 3.0000 mL | Freq: Two times a day (BID) | INTRAMUSCULAR | Status: DC

## 2013-02-16 MED ORDER — OXYCODONE-ACETAMINOPHEN 10-325 MG PO TABS: 1.0000 | ORAL_TABLET | ORAL | Status: DC | PRN

## 2013-02-16 MED ORDER — 0.9 % SODIUM CHLORIDE (POUR BTL) OPTIME
TOPICAL | Status: DC | PRN
  Administered 2013-02-16: 1000 mL

## 2013-02-16 MED ORDER — IOHEXOL 300 MG/ML  SOLN
INTRAMUSCULAR | Status: DC | PRN
  Administered 2013-02-16: 5 mL via INTRAVENOUS

## 2013-02-16 MED ORDER — NEOSTIGMINE METHYLSULFATE 1 MG/ML IJ SOLN
INTRAMUSCULAR | Status: DC | PRN
  Administered 2013-02-16: 3 mg via INTRAVENOUS

## 2013-02-16 MED ORDER — LIDOCAINE HCL (CARDIAC) 20 MG/ML IV SOLN
INTRAVENOUS | Status: DC | PRN
  Administered 2013-02-16: 60 mg via INTRAVENOUS

## 2013-02-16 MED ORDER — PROPOFOL 10 MG/ML IV BOLUS
INTRAVENOUS | Status: DC | PRN
  Administered 2013-02-16: 150 mg via INTRAVENOUS

## 2013-02-16 MED ORDER — PHENYLEPHRINE HCL 10 MG/ML IJ SOLN
INTRAMUSCULAR | Status: DC | PRN
  Administered 2013-02-16: 80 ug via INTRAVENOUS

## 2013-02-16 MED ORDER — MIDAZOLAM HCL 5 MG/5ML IJ SOLN
INTRAMUSCULAR | Status: DC | PRN
  Administered 2013-02-16: 2 mg via INTRAVENOUS

## 2013-02-16 MED ORDER — SODIUM CHLORIDE 0.9 % IJ SOLN: 3.0000 mL | INTRAMUSCULAR | Status: DC | PRN

## 2013-02-16 MED ORDER — SODIUM CHLORIDE 0.9 % IR SOLN
Status: DC | PRN
  Administered 2013-02-16: 1

## 2013-02-16 MED ORDER — BUPIVACAINE HCL (PF) 0.25 % IJ SOLN
INTRAMUSCULAR | Status: AC
  Filled 2013-02-16: qty 30

## 2013-02-16 MED ORDER — BUPIVACAINE HCL 0.25 % IJ SOLN
INTRAMUSCULAR | Status: DC | PRN
  Administered 2013-02-16: 6 mL

## 2013-02-16 MED ORDER — PROMETHAZINE HCL 25 MG/ML IJ SOLN: 6.2500 mg | INTRAMUSCULAR | Status: DC | PRN

## 2013-02-16 MED ORDER — MEPERIDINE HCL 25 MG/ML IJ SOLN: 6.2500 mg | INTRAMUSCULAR | Status: DC | PRN

## 2013-02-16 MED ORDER — OXYCODONE HCL 5 MG PO TABS
5.0000 mg | ORAL_TABLET | ORAL | Status: DC | PRN
  Administered 2013-02-16: 5 mg via ORAL

## 2013-02-16 MED ORDER — ARTIFICIAL TEARS OP OINT
TOPICAL_OINTMENT | OPHTHALMIC | Status: DC | PRN
  Administered 2013-02-16: 1 via OPHTHALMIC

## 2013-02-16 SURGICAL SUPPLY — 44 items
APPLIER CLIP 5 13 M/L LIGAMAX5 (MISCELLANEOUS) ×2
BENZOIN TINCTURE PRP APPL 2/3 (GAUZE/BANDAGES/DRESSINGS) ×2 IMPLANT
CANISTER SUCTION 2500CC (MISCELLANEOUS) ×2 IMPLANT
CHLORAPREP W/TINT 26ML (MISCELLANEOUS) ×2 IMPLANT
CLIP APPLIE 5 13 M/L LIGAMAX5 (MISCELLANEOUS) ×1 IMPLANT
CLIP LIGATING HEMO O LOK GREEN (MISCELLANEOUS) ×4 IMPLANT
CLOTH BEACON ORANGE TIMEOUT ST (SAFETY) ×2 IMPLANT
COVER MAYO STAND STRL (DRAPES) ×2 IMPLANT
COVER SURGICAL LIGHT HANDLE (MISCELLANEOUS) ×2 IMPLANT
COVER TRANSDUCER ULTRASND (DRAPES) IMPLANT
DEVICE TROCAR PUNCTURE CLOSURE (ENDOMECHANICALS) ×2 IMPLANT
DRAPE C-ARM 42X72 X-RAY (DRAPES) ×2 IMPLANT
DRAPE UTILITY 15X26 W/TAPE STR (DRAPE) ×4 IMPLANT
ELECT REM PT RETURN 9FT ADLT (ELECTROSURGICAL) ×2
ELECTRODE REM PT RTRN 9FT ADLT (ELECTROSURGICAL) ×1 IMPLANT
GAUZE SPONGE 2X2 8PLY STRL LF (GAUZE/BANDAGES/DRESSINGS) ×1 IMPLANT
GLOVE BIO SURGEON STRL SZ 6.5 (GLOVE) ×2 IMPLANT
GLOVE BIO SURGEON STRL SZ7.5 (GLOVE) ×6 IMPLANT
GLOVE BIOGEL PI IND STRL 7.0 (GLOVE) ×1 IMPLANT
GLOVE BIOGEL PI IND STRL 7.5 (GLOVE) ×1 IMPLANT
GLOVE BIOGEL PI INDICATOR 7.0 (GLOVE) ×1
GLOVE BIOGEL PI INDICATOR 7.5 (GLOVE) ×1
GLOVE SURG SS PI 7.0 STRL IVOR (GLOVE) ×2 IMPLANT
GOWN STRL NON-REIN LRG LVL3 (GOWN DISPOSABLE) ×6 IMPLANT
GOWN STRL REIN XL XLG (GOWN DISPOSABLE) ×2 IMPLANT
IV CATH 14GX2 1/4 (CATHETERS) ×2 IMPLANT
KIT BASIN OR (CUSTOM PROCEDURE TRAY) ×2 IMPLANT
KIT ROOM TURNOVER OR (KITS) ×2 IMPLANT
NEEDLE INSUFFLATION 14GA 120MM (NEEDLE) ×2 IMPLANT
NS IRRIG 1000ML POUR BTL (IV SOLUTION) ×2 IMPLANT
PAD ARMBOARD 7.5X6 YLW CONV (MISCELLANEOUS) ×4 IMPLANT
POUCH SPECIMEN RETRIEVAL 10MM (ENDOMECHANICALS) IMPLANT
SCISSORS LAP 5X35 DISP (ENDOMECHANICALS) ×2 IMPLANT
SET CHOLANGIOGRAPHY FRANKLIN (SET/KITS/TRAYS/PACK) ×2 IMPLANT
SET IRRIG TUBING LAPAROSCOPIC (IRRIGATION / IRRIGATOR) ×2 IMPLANT
SLEEVE ENDOPATH XCEL 5M (ENDOMECHANICALS) ×2 IMPLANT
SPECIMEN JAR SMALL (MISCELLANEOUS) ×2 IMPLANT
SPONGE GAUZE 2X2 STER 10/PKG (GAUZE/BANDAGES/DRESSINGS) ×1
SUT MNCRL AB 3-0 PS2 18 (SUTURE) ×2 IMPLANT
TOWEL OR 17X24 6PK STRL BLUE (TOWEL DISPOSABLE) ×2 IMPLANT
TOWEL OR 17X26 10 PK STRL BLUE (TOWEL DISPOSABLE) ×2 IMPLANT
TRAY LAPAROSCOPIC (CUSTOM PROCEDURE TRAY) ×2 IMPLANT
TROCAR XCEL NON-BLD 11X100MML (ENDOMECHANICALS) ×2 IMPLANT
TROCAR XCEL NON-BLD 5MMX100MML (ENDOMECHANICALS) ×2 IMPLANT

## 2013-02-16 NOTE — Anesthesia Postprocedure Evaluation (Signed)
  Anesthesia Post-op Note  Patient: Vanessa Bryant  Procedure(s) Performed: Procedure(s): LAPAROSCOPIC CHOLECYSTECTOMY WITH INTRAOPERATIVE CHOLANGIOGRAM (N/A)  Patient Location: PACU  Anesthesia Type:General  Level of Consciousness: awake, alert , oriented and patient cooperative  Airway and Oxygen Therapy: Patient Spontanous Breathing  Post-op Pain: mild  Post-op Assessment: Post-op Vital signs reviewed, Patient's Cardiovascular Status Stable, Respiratory Function Stable, Patent Airway, No signs of Nausea or vomiting and Pain level controlled  Post-op Vital Signs: Reviewed and stable  Complications: No apparent anesthesia complications

## 2013-02-16 NOTE — H&P (View-Only) (Signed)
Patient ID: Vanessa Bryant, female   DOB: April 16, 1981, 32 y.o.   MRN: [id number]  No chief complaint on file.   HPI Vanessa Bryant is a 32 y.o. female.  The patient is a 32 year old female has been diagnosed with gallstones. The patient states she began having right upper quadrant pain approximately 5 months ago after the birth of her second child. She states that all her pain is becoming more frequent now to approximately once a week. She has been staying along the fatty foods, but continues with pain. She does feel that the amount of food she's also contributes to the pain.  HPI  Past Medical History  Diagnosis Date  . Medical history non-contributory     Past Surgical History  Procedure Laterality Date  . No past surgeries      Family History  Problem Relation Age of Onset  . Heart disease Mother   . Hypertension Mother   . Hypertension Father   . Diabetes Maternal Grandmother   . Diabetes Maternal Grandfather     Social History History  Substance Use Topics  . Smoking status: Never Smoker   . Smokeless tobacco: Never Used  . Alcohol Use: No    Allergies  Allergen Reactions  . Penicillins Rash    As a child     Current Outpatient Prescriptions  Medication Sig Dispense Refill  . HEATHER 0.35 MG tablet Take 1 tablet by mouth daily.       Marland Kitchen ibuprofen (ADVIL,MOTRIN) 600 MG tablet Take 1 tablet (600 mg total) by mouth every 6 (six) hours.  30 tablet  1  . oxyCODONE-acetaminophen (PERCOCET/ROXICET) 5-325 MG per tablet Take 1 tablet by mouth every 4 (four) hours as needed for pain.      . Prenatal Vit-Fe Fumarate-FA (PRENATAL MULTIVITAMIN) TABS Take 1 tablet by mouth daily at 12 noon.      . promethazine (PHENERGAN) 25 MG tablet Take 1 tablet (25 mg total) by mouth every 6 (six) hours as needed for nausea.  30 tablet  1   No current facility-administered medications for this visit.    Review of Systems Review of Systems  HENT: Negative.   Respiratory: Negative.    Cardiovascular: Negative.   Gastrointestinal: Positive for abdominal pain.  Musculoskeletal: Negative.   Neurological: Negative.   All other systems reviewed and are negative.    Blood pressure 100/60, pulse 64, resp. rate 16, height 5\' 2"  (1.575 m), weight 116 lb 6.4 oz (52.799 kg).  Physical Exam Physical Exam  Constitutional: She is oriented to person, place, and time. She appears well-developed.  HENT:  Head: Normocephalic and atraumatic.  Eyes: Conjunctivae and EOM are normal. Pupils are equal, round, and reactive to light.  Neck: Normal range of motion. Neck supple.  Cardiovascular: Normal rate, regular rhythm and normal heart sounds.   Pulmonary/Chest: Effort normal and breath sounds normal.  Abdominal: Soft. Bowel sounds are normal. She exhibits no distension. There is tenderness (min to RUQ). There is no rebound and no guarding.  Musculoskeletal: Normal range of motion.  Neurological: She is alert and oriented to person, place, and time.  Skin: Skin is warm and dry.    Data Reviewed CT scan reveals large gallstone within the gallbladder.  Assessment    32 year old female with symptomatic cholelithiasis     Plan     1. We will proceed to the operating room for laparoscopic cholecystectomy with IOC.  2. All risks and benefits were discussed with the patient to  generally include: infection, bleeding, possible need for post op ERCP, damage to the bile ducts, and bile leak. Alternatives were offered and described.  All questions were answered and the patient voiced understanding of the procedure and wishes to proceed at this point with a laparoscopic cholecystectomy        Marigene Ehlers., [facility] 01/22/2013, 10:37 AM

## 2013-02-16 NOTE — Transfer of Care (Signed)
Immediate Anesthesia Transfer of Care Note  Patient: Vanessa Bryant  Procedure(s) Performed: Procedure(s): LAPAROSCOPIC CHOLECYSTECTOMY WITH INTRAOPERATIVE CHOLANGIOGRAM (N/A)  Patient Location: PACU  Anesthesia Type:General  Level of Consciousness: awake, alert  and oriented  Airway & Oxygen Therapy: Patient Spontanous Breathing and Patient connected to nasal cannula oxygen  Post-op Assessment: Report given to PACU RN  Post vital signs: Reviewed and stable  Complications: No apparent anesthesia complications

## 2013-02-16 NOTE — Preoperative (Signed)
Beta Blockers   Reason not to administer Beta Blockers:Not Applicable 

## 2013-02-16 NOTE — Interval H&P Note (Signed)
History and Physical Interval Note:  02/16/2013 7:25 AM  Vanessa Bryant  has presented today for surgery, with the diagnosis of gallstones  The various methods of treatment have been discussed with the patient and family. After consideration of risks, benefits and other options for treatment, the patient has consented to  Procedure(s): LAPAROSCOPIC CHOLECYSTECTOMY WITH INTRAOPERATIVE CHOLANGIOGRAM (N/A) as a surgical intervention .  The patient's history has been reviewed, patient examined, no change in status, stable for surgery.  I have reviewed the patient's chart and labs.  Questions were answered to the patient's satisfaction.     Marigene Ehlers., [facility]

## 2013-02-16 NOTE — Op Note (Signed)
Pre Operative Diagnosis: synptomatic cholelithiasis   Post Operative Diagnosis: same   Procedure: Lap chole with IOC   Surgeon: Dr. Axel Filler   Assistant: none   Anesthesia: GETA   EBL: <5cc   Complications: none   Counts: reported as correct x 2   Findings:normal IOC with no filling defects   Indications for procedure: Pt is a 32 y/o F  with RUQ pain and seen to have gallstones and RUQ attacks during pregnancy  Details of the procedure: The patient was taken to the operating and placed in the supine position with bilateral SCDs in place. A time out was called and all facts were verified. A pneumoperitoneum was obtained via A Veress needle technique to a pressure of 14mm of mercury. A 5mm trochar was then placed in the right upper quadrant under visualization, and there were no injuries to any abdominal organs. A 11 mm port was then placed in the umbilical region after infiltrating with local anesthesia under direct visualization. A second epigastric port was placed under direct visualization. The gallbladder was identified and retracted, the peritoneum was then sharply dissected from the gallbladder and this dissection was carried down to Calot's triangle. The cystic duct was identified and stripped away circumferentially and seen going into the gallbladder 360, the critical angle was obtained. It was noted to be very dilated and large. A Cook catheter was used to perform an intraoperative cholangiogram. The cystic duct and common bile duct were seen free of filling defects. 2 clips were placed proximally one distally and the cystic duct transected. The cystic artery was identified and 2 clips placed proximally and one distally and transected. We then proceeded to remove the gallbladder off the hepatic fossa with Bovie cautery. A retrieval bag was then placed in the abdomen and gallbladder placed in the bag. The hepatic fossa was then reexamined and hemostasis was achieved with Bovie  cautery and was excellent at this portion of the case. The subhepatic fossa and perihepatic fossa was then irrigated until the effluent was clear. The 11 mm trocar fascia was reapproximated with the Endo Close #1 Vicryl x2. The pneumoperitoneum was evacuated and all trochars removed under direct visulalization. The skin was then closed with 3-0 Monocryl and the skin dressed with Steri-Strips, gauze, and tape. The patient was awaken from general anesthesia and taken to the recovery room in stable condition.

## 2013-02-16 NOTE — Anesthesia Preprocedure Evaluation (Addendum)
Anesthesia Evaluation  Patient identified by MRN, date of birth, ID band Patient awake    Reviewed: Allergy & Precautions, H&P , NPO status , Patient's Chart, lab work & pertinent test results  History of Anesthesia Complications Negative for: history of anesthetic complications  Airway Mallampati: II TM Distance: >3 FB Neck ROM: Full    Dental  (+) Teeth Intact and Dental Advisory Given   Pulmonary neg pulmonary ROS,  breath sounds clear to auscultation  Pulmonary exam normal       Cardiovascular negative cardio ROS  Rhythm:Regular Rate:Normal     Neuro/Psych negative neurological ROS     GI/Hepatic Neg liver ROS, N/v with gallbladder   Endo/Other  negative endocrine ROS  Renal/GU negative Renal ROS     Musculoskeletal   Abdominal Normal abdominal exam  (+)   Peds  Hematology   Anesthesia Other Findings   Reproductive/Obstetrics 02/05/13 preg neg                          Anesthesia Physical Anesthesia Plan  ASA: II  Anesthesia Plan: General   Post-op Pain Management:    Induction: Intravenous  Airway Management Planned: Oral ETT  Additional Equipment:   Intra-op Plan:   Post-operative Plan: Extubation in OR  Informed Consent: I have reviewed the patients History and Physical, chart, labs and discussed the procedure including the risks, benefits and alternatives for the proposed anesthesia with the patient or authorized representative who has indicated his/her understanding and acceptance.   Dental advisory given  Plan Discussed with: CRNA and Surgeon  Anesthesia Plan Comments: (Plan routine monitors, GETA Pt breast feeding: understands to express and discard breast milk while on pain meds)       Anesthesia Quick Evaluation

## 2013-02-18 ENCOUNTER — Encounter (HOSPITAL_COMMUNITY): Payer: Self-pay | Admitting: General Surgery

## 2013-03-05 ENCOUNTER — Ambulatory Visit (INDEPENDENT_AMBULATORY_CARE_PROVIDER_SITE_OTHER): Payer: BC Managed Care – PPO | Admitting: General Surgery

## 2013-03-05 ENCOUNTER — Encounter (INDEPENDENT_AMBULATORY_CARE_PROVIDER_SITE_OTHER): Payer: Self-pay

## 2013-03-05 ENCOUNTER — Encounter (INDEPENDENT_AMBULATORY_CARE_PROVIDER_SITE_OTHER): Payer: Self-pay | Admitting: General Surgery

## 2013-03-05 VITALS — BP 130/80 | HR 68 | Temp 98.4°F | Resp 14 | Ht 62.0 in | Wt 110.8 lb

## 2013-03-05 DIAGNOSIS — Z9889 Other specified postprocedural states: Secondary | ICD-10-CM

## 2013-03-05 DIAGNOSIS — Z9049 Acquired absence of other specified parts of digestive tract: Secondary | ICD-10-CM

## 2013-03-05 NOTE — Progress Notes (Signed)
Patient ID: Vanessa Bryant, female   DOB: [birth year], 32 y.o.   MRN: [id number] Post op course Patient has been doing well postoperatively. She's had no further right upper quadrant pain at this time.  On Exam: Her wound clean dry and intact.  Pathology:   Showed chronic cholecystitis, cholelithiasis.  This was discussed with the patient.  Assessment and Plan 32 year old female status post laparoscopic cholecystectomy 1. This can follow up as needed   Axel Filler, MD [facility] General & Minimally Invasive Surgery Trauma & Emergency Surgery

## 2013-12-26 IMAGING — CT CT ABD-PELV W/O CM
2 of 3 series · 9 of 46 positions shown, 11 images · non-contrast
Comparison: None

CLINICAL DATA: 32-year-old female with right flank, abdominal and
pelvic pain.

CT ABDOMEN AND PELVIS WITHOUT CONTRAST
TECHNIQUE: Multidetector CT imaging of the abdomen and pelvis was
performed following the standard protocol without intravenous
contrast.

[Series 4: mpr coronal (id) · coronal · 0.83mm/px · 8 of 67 slices shown, 9 images]
[im 8/67  soft-tissue]
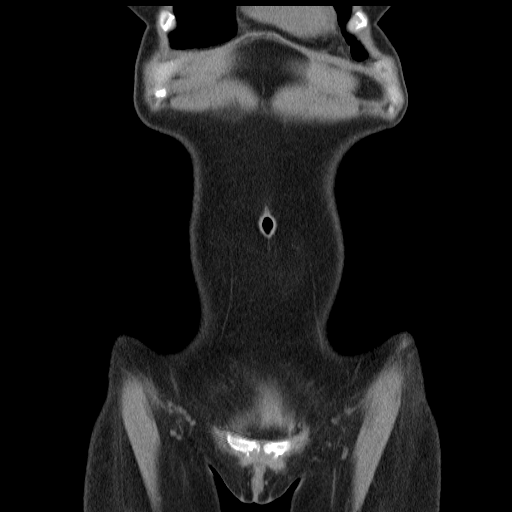
[im 8/67  bone]
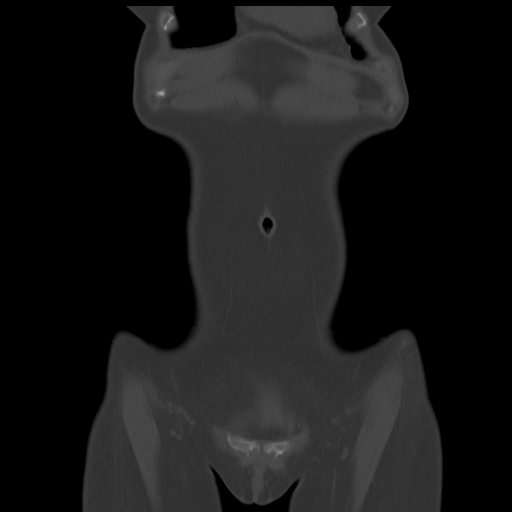
[im 15/67  soft-tissue]
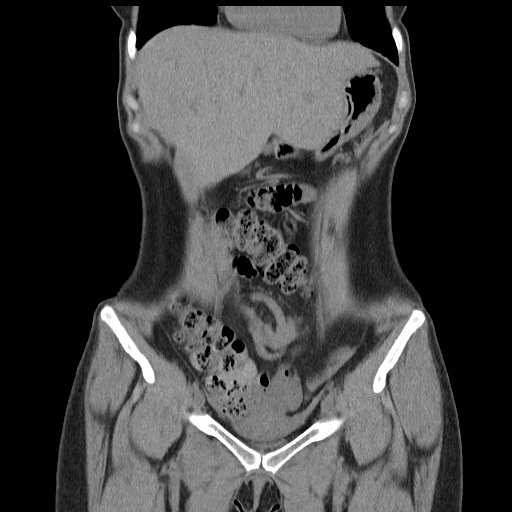
[im 23/67  soft-tissue]
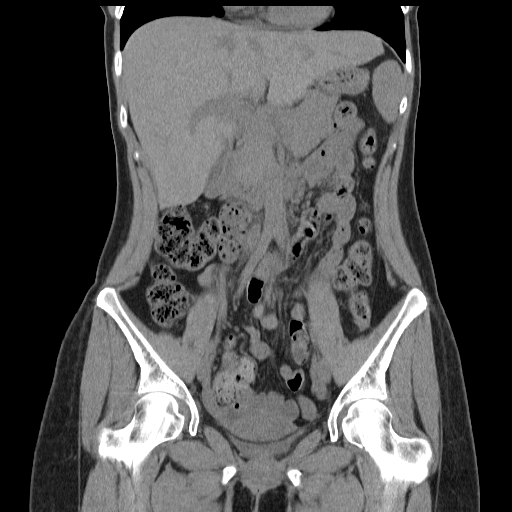
[im 30/67  soft-tissue]
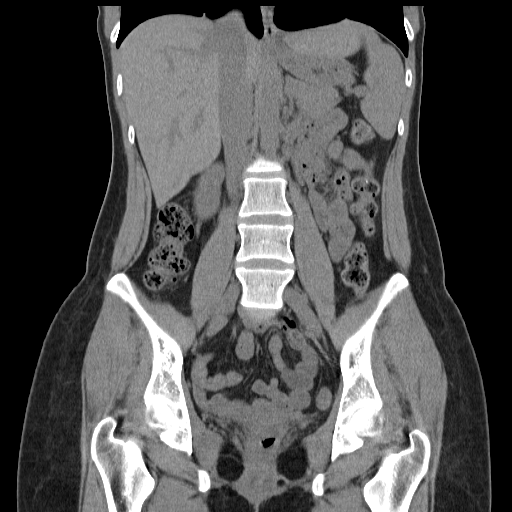
[im 37/67  soft-tissue]
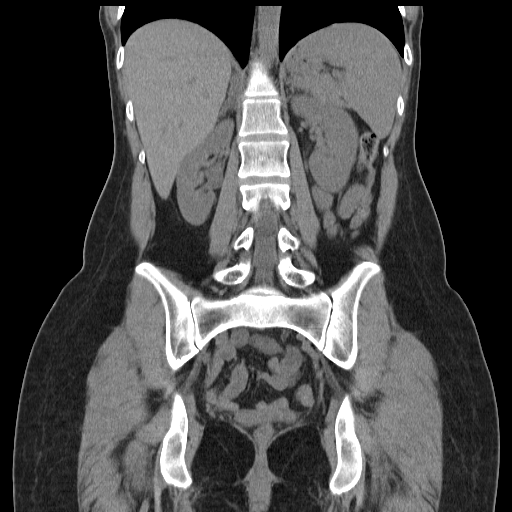
[im 45/67  soft-tissue]
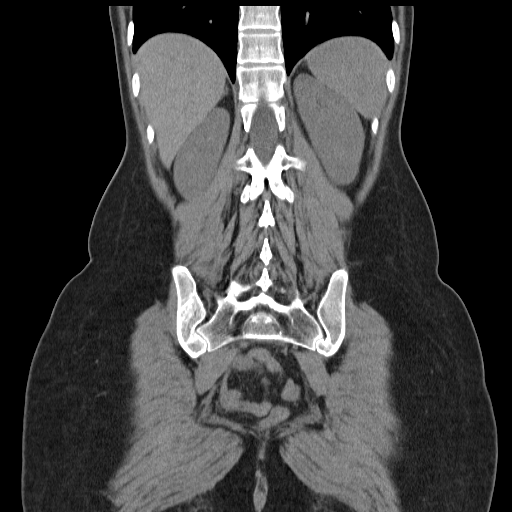
[im 52/67  soft-tissue]
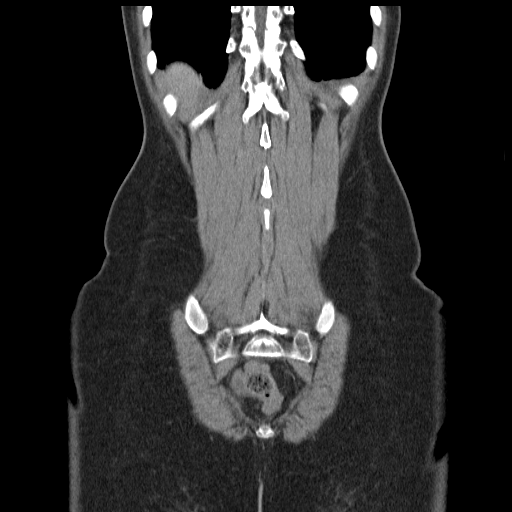
[im 59/67  soft-tissue]
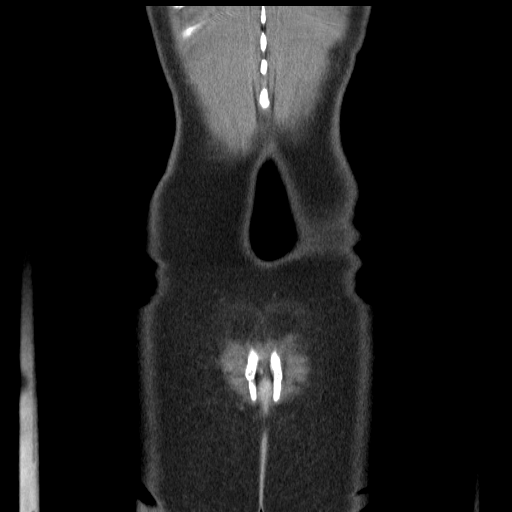

[Series 5: mpr sagittal (id) · sagittal · 0.44mm/px · 1 of 103 slices shown, 2 images]
[im 35/103  soft-tissue]
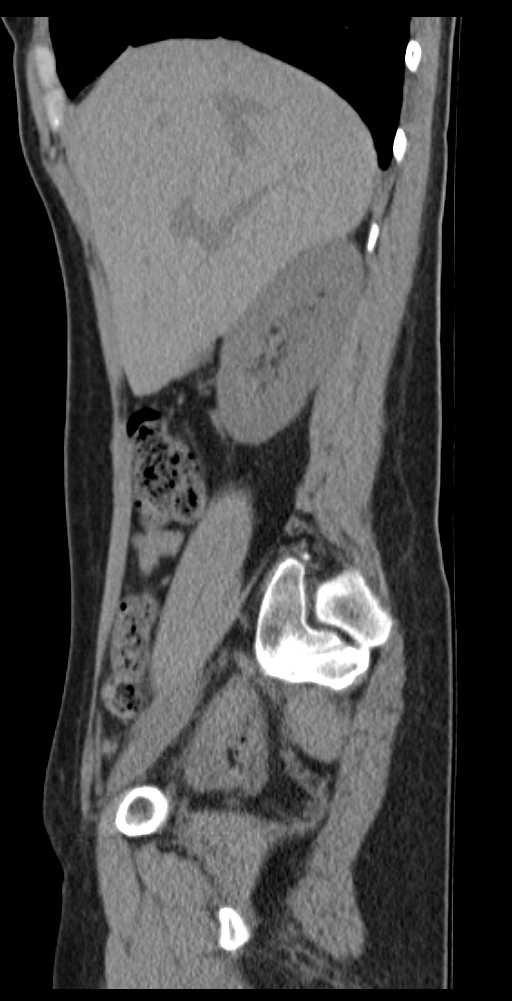
[im 35/103  bone]
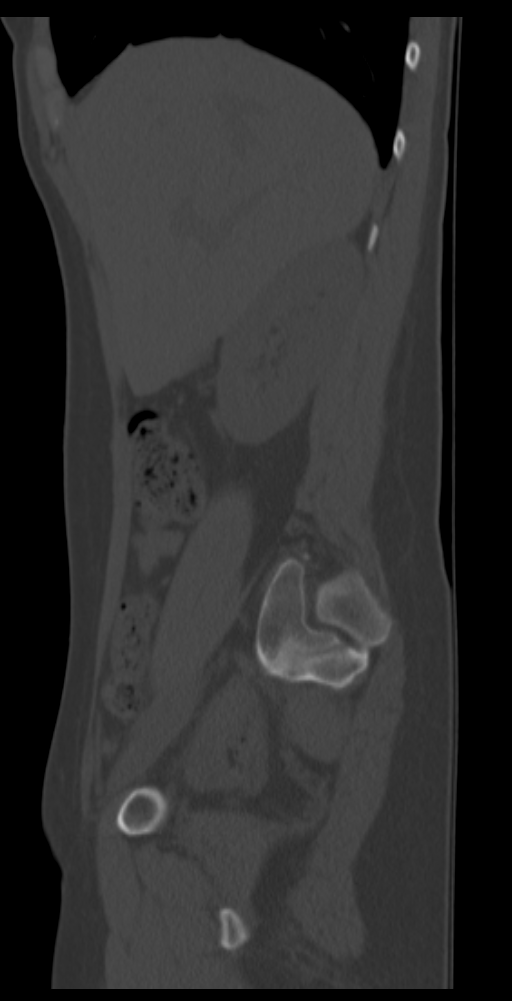

[9 of 46 positions shown; findings below may reference images not displayed]

FINDINGS: The kidneys bilaterally are unremarkable.
There is no evidence of hydronephrosis or urinary calculi.

Cholelithiasis identified without CT evidence of acute
cholecystitis.
The liver, spleen, pancreas and adrenal glands are unremarkable.

Please note that parenchymal abnormalities may be missed as
intravenous contrast was not administered.

No free fluid, enlarged lymph nodes, biliary dilation or abdominal
aortic aneurysm identified.

The bowel, appendix and bladder are unremarkable.
The uterus and adnexal regions are within normal limits.

No acute or suspicious bony abnormalities are identified.
IMPRESSION: No evidence of acute abnormality.

Cholelithiasis without CT evidence of acute cholecystitis.  If
there is strong clinical concern for cholecystitis, consider
ultrasound or nuclear medicine evaluation.

## 2014-03-15 ENCOUNTER — Encounter (INDEPENDENT_AMBULATORY_CARE_PROVIDER_SITE_OTHER): Payer: Self-pay | Admitting: General Surgery

## 2016-03-05 ENCOUNTER — Other Ambulatory Visit (HOSPITAL_COMMUNITY)
Admission: RE | Admit: 2016-03-05 | Discharge: 2016-03-05 | Disposition: A | Discharge status: Home / Self Care | Payer: Self-pay | Source: Ambulatory Visit | Attending: Adult Health | Admitting: Adult Health

## 2016-03-05 ENCOUNTER — Encounter: Payer: Self-pay | Admitting: Adult Health

## 2016-03-05 ENCOUNTER — Ambulatory Visit (INDEPENDENT_AMBULATORY_CARE_PROVIDER_SITE_OTHER): Payer: Self-pay | Admitting: Adult Health

## 2016-03-05 VITALS — BP 100/58 | HR 64 | Ht 62.5 in | Wt 112.5 lb

## 2016-03-05 DIAGNOSIS — Z1151 Encounter for screening for human papillomavirus (HPV): Secondary | ICD-10-CM | POA: Insufficient documentation

## 2016-03-05 DIAGNOSIS — Z01411 Encounter for gynecological examination (general) (routine) with abnormal findings: Secondary | ICD-10-CM | POA: Insufficient documentation

## 2016-03-05 DIAGNOSIS — Z01419 Encounter for gynecological examination (general) (routine) without abnormal findings: Secondary | ICD-10-CM

## 2016-03-05 NOTE — Progress Notes (Signed)
Patient ID: Vanessa Bryant, female   DOB: 06/08/1980, 35 y.o.   MRN: [id number] History of Present Illness: Vanessa Bryant is a 35 year old white female in for well woman gyn exam and pap. [place] [facility]    Current Medications, Allergies, Past Medical History, Past Surgical History, Family History and Social History were reviewed in [facility] electronic medical record.     Review of Systems:  Patient denies any headaches, hearing loss, fatigue, blurred vision, shortness of breath, chest pain, abdominal pain, problems with bowel movements, urination, or intercourse(sometimes feels like moving something). No joint pain or mood swings.   Physical Exam:BP (!) 100/58 (BP Location: Left Arm, Patient Position: Sitting, Cuff Size: Normal)   Pulse 64   Ht 5' 2.5" (1.588 m)   Wt 112 lb 8 oz (51 kg)   LMP 02/11/2016 (Exact Date)   BMI 20.25 kg/m  General:  Well developed, well nourished, no acute distress Skin:  Warm and dry Neck:  Midline trachea, normal thyroid, good ROM, no lymphadenopathy Lungs; Clear to auscultation bilaterally Breast:  No dominant palpable mass, retraction, or nipple discharge Cardiovascular: Regular rate and rhythm Abdomen:  Soft, non tender, no hepatosplenomegaly Pelvic:  External genitalia is normal in appearance, no lesions.  The vagina is normal in appearance. Urethra has no lesions or masses. The cervix is bulbous,everted at os and has nabothian cyst at 8 o'clock,pap with HPV performed.  Uterus is felt to be normal size, shape, and contour.  No adnexal masses or tenderness noted.Bladder is non tender, no masses felt. Extremities/musculoskeletal:  No swelling or varicosities noted, no clubbing or cyanosis Psych:  No mood changes, alert and cooperative,seems happy PHQ 2 score 0.  Impression:  1. Encounter for gynecological examination with Papanicolaou smear of cervix      Plan: Physical in 1 year, pap in 3 if normal Mammogram at 40 Labs in near future

## 2016-03-05 NOTE — Patient Instructions (Addendum)
Physical in 1 year, pap in 3 if normal Mammogram at 40 Labs in near future

## 2016-03-07 LAB — CYTOLOGY - PAP
DIAGNOSIS: NEGATIVE
HPV (WINDOPATH): NOT DETECTED

## 2016-04-03 ENCOUNTER — Other Ambulatory Visit: Payer: Self-pay | Admitting: Adult Health

## 2019-03-30 ENCOUNTER — Encounter: Payer: Self-pay | Admitting: Internal Medicine

## 2019-04-07 ENCOUNTER — Encounter: Payer: Self-pay | Admitting: Gastroenterology

## 2019-04-07 NOTE — Progress Notes (Signed)
Referring Provider: Assunta Found, MD Primary Care Physician:  Assunta Found, MD Primary Gastroenterologist:  Dr. Jena Gauss  Chief Complaint  Patient presents with  . Abdominal Pain    under rib cage, on and off for few years  . Nausea    occ  . Constipation  . Diarrhea    HPI:   Vanessa Bryant is a 38 y.o. female presenting today at the request of Assunta Found, MD for abdominal pain.   Patient saw PCP on 03/27/2019.  Plans to trial Xifaxan 3 times daily x2 weeks for possible IBS-D. She was also being seen for follow-up of anemia.  Her labs were rechecked on 03/27/2019 with hemoglobin of 14.3.  Today she states she did not try Xifaxan as it was going to cost $1800. Has been told she has IBS. Has been a 15 year history of abdominal trouble.   Abdominal Pain:Cholecystectomy in 2014.  This helped with a lot.  Now over the last last few years, has pain in the epigastric area that will shoot to her back. Also with epigastric burning. Gnawing pain. Gnawing sensation is random. May be months without symptoms, then bothersome a few days, then go away. Pain shooting to her back is about once a week. Pain is more of an all day thing. Sometimes will last a couple days. Pretty consistent. Not severe. Can function and work. More of an annoying and "I don't feel very good today feeling." Eating will make it worse. Fried foods will make things worse. No heartburn or acid reflux. Taking ibuprofen around the time of her period, headaches, and when pain comes on. About 200 mg at a time about 5-6 days a week. No dysphagia.   Nausea: Comes and goes. Thinks it goes together with the pain. Mild nausea is somewhat daily. No vomiting. No unintentional weight loss.   Loose stools: Some days up to 3-4 a day. Other days 1 and done. Between [place] 5-6. This is typical for her. Present for a few years. Occasionally will have a Bristol 3-4. BMs are typically after meals. Worse with fried foods, steak, and pork.  Sometimes worse with pasta. Bowels were this way before cholecystectomy. Associated abdominal cramping. Resolves after a BM. Associated urgency. Very rarely does she have constipation. Has had toilet tissue hematochezia about 3 times in the last year. Associated with her rare episodes of constipation. This is new. No blood in the stool. Reports history of hemorrhoids that prolapse out. Doesn't use any creams for this. No black stools unless she takes iron pills. Taking iron a couple of times a week. Reports history of IDA. Reports hemoglobin was 8.5 last year. Took iron for 6 months. No heavy periods.  No nocturnal stools. Will take imodium as needed.     Past Medical History:  Diagnosis Date  . Anxiety     Past Surgical History:  Procedure Laterality Date  . CHOLECYSTECTOMY N/A 02/16/2013   Procedure: LAPAROSCOPIC CHOLECYSTECTOMY WITH INTRAOPERATIVE CHOLANGIOGRAM;  Surgeon: Axel Filler, MD;  Location: [facility] OR;  Service: General;  Laterality: N/A;  . WISDOM TOOTH EXTRACTION      Current Outpatient Medications  Medication Sig Dispense Refill  . OVER THE COUNTER MEDICATION Iron twice a week    . Probiotic Product (PROBIOTIC DAILY PO) Take by mouth daily.    . pantoprazole (PROTONIX) 40 MG tablet Take 1 tablet (40 mg total) by mouth daily. 30 tablet 5   No current facility-administered medications for this visit.  Allergies as of 04/08/2019 - Review Complete 04/08/2019  Allergen Reaction Noted  . Penicillins Rash 08/27/2012    Family History  Problem Relation Age of Onset  . Heart disease Mother   . Hypertension Mother   . Hypertension Father   . Heart attack Maternal Grandmother   . Heart attack Maternal Grandfather   . Diabetes Paternal Grandfather   . Diabetes Paternal Grandmother   . Colon cancer Neg Hx   . Inflammatory bowel disease Neg Hx     Social History   Socioeconomic History  . Marital status: Married    Spouse name: Not on file  . Number of children: Not  on file  . Years of education: Not on file  . Highest education level: Not on file  Occupational History  . Not on file  Social Needs  . Financial resource strain: Not on file  . Food insecurity    Worry: Not on file    Inability: Not on file  . Transportation needs    Medical: Not on file    Non-medical: Not on file  Tobacco Use  . Smoking status: Never Smoker  . Smokeless tobacco: Never Used  Substance and Sexual Activity  . Alcohol use: No  . Drug use: No  . Sexual activity: Yes    Birth control/protection: Surgical    Comment: vasectomy   Lifestyle  . Physical activity    Days per week: Not on file    Minutes per session: Not on file  . Stress: Not on file  Relationships  . Social Musicianconnections    Talks on phone: Not on file    Gets together: Not on file    Attends religious service: Not on file    Active member of club or organization: Not on file    Attends meetings of clubs or organizations: Not on file    Relationship status: Not on file  . Intimate partner violence    Fear of current or ex partner: Not on file    Emotionally abused: Not on file    Physically abused: Not on file    Forced sexual activity: Not on file  Other Topics Concern  . Not on file  Social History Narrative  . Not on file    Review of Systems: Gen: Denies any fever, chills, some swimmy headedness at times when laying down at night.  CV: Denies chest pain. Occasional heart palpitations.  Resp: Denies shortness of breath at rest or cough. GI: See HPI GU : Denies urinary burning, urinary frequency, urinary hesitancy MS: Right hip and right knee pain.  Derm: Denies rash Psych: Denies depression. Admits to mild anxiety.  Heme: Denies bruising  Physical Exam: BP 127/75   Pulse 80   Temp (!) 97.1 F (36.2 C) (Temporal)   Ht 5\' 2"  (1.575 m)   Wt 119 lb 12.8 oz (54.3 kg)   LMP 03/29/2019   BMI 21.91 kg/m  General:   Alert and oriented. Pleasant and cooperative. Well-nourished and  well-developed.  Head:  Normocephalic and atraumatic. Eyes:  Without icterus, sclera clear and conjunctiva pink.  Ears:  Normal auditory acuity. Lungs:  Clear to auscultation bilaterally. No wheezes, rales, or rhonchi. No distress.  Heart:  S1, S2 present without murmurs appreciated.  Abdomen:  +BS, soft, non-tender and non-distended. No HSM noted. No guarding or rebound. No masses appreciated.  Rectal:  Deferred  Msk:  Symmetrical without gross deformities. Normal posture. Extremities:  Without edema. Neurologic:  Alert and  oriented x4;  grossly normal neurologically. Skin:  Intact without significant lesions or rashes. Psych:  Normal mood and affect.  Labs: 03/27/2019 CBC: WBC 7.3, hemoglobin 14.3, MCV 84, MCHC 34.9, MCH 29.1, platelets 181

## 2019-04-08 ENCOUNTER — Other Ambulatory Visit: Payer: Self-pay

## 2019-04-08 ENCOUNTER — Ambulatory Visit (INDEPENDENT_AMBULATORY_CARE_PROVIDER_SITE_OTHER): Payer: No Typology Code available for payment source | Admitting: Gastroenterology

## 2019-04-08 ENCOUNTER — Encounter: Payer: Self-pay | Admitting: Gastroenterology

## 2019-04-08 VITALS — BP 127/75 | HR 80 | Temp 97.1°F | Ht 62.0 in | Wt 119.8 lb

## 2019-04-08 DIAGNOSIS — R1013 Epigastric pain: Secondary | ICD-10-CM | POA: Diagnosis not present

## 2019-04-08 DIAGNOSIS — R195 Other fecal abnormalities: Secondary | ICD-10-CM

## 2019-04-08 DIAGNOSIS — D509 Iron deficiency anemia, unspecified: Secondary | ICD-10-CM | POA: Diagnosis not present

## 2019-04-08 DIAGNOSIS — K625 Hemorrhage of anus and rectum: Secondary | ICD-10-CM | POA: Diagnosis not present

## 2019-04-08 MED ORDER — PEG 3350-KCL-NA BICARB-NACL 420 G PO SOLR: 4000.0000 mL | ORAL | 0 refills | Status: DC

## 2019-04-08 MED ORDER — PANTOPRAZOLE SODIUM 40 MG PO TBEC: 40.0000 mg | DELAYED_RELEASE_TABLET | Freq: Every day | ORAL | 5 refills | Status: DC

## 2019-04-08 NOTE — Assessment & Plan Note (Addendum)
38 year old female who reports history of iron deficiency anemia with hemoglobin 8.5 last year.  She was placed on daily iron supplements for 6 months and her hemoglobin has returned to normal.  Most recent hemoglobin 14.3 on 03/27/2019. She continues on iron a couple of times a week at this point.  Denies any heavy menstrual cycles.  She does report tissue hematochezia about 3 times within the last year associated with rare constipation.  Rectal bleeding is new for her.  Reports known hemorrhoids that prolapse at times.  Denies melena.  Other significant GI symptoms include a few year history of intermittent sharp epigastric pain that radiates to her back, epigastric burning, and loose stools with 3-4 BMs daily, usually postprandial with associated urgency and abdominal cramping.  Denies unintentional weight loss.  With hemoglobin in the 8 range with no other significant medical history or report of heavy menstrual cycle, there is concern for potential GI etiology with differentials including H pylori, celiac disease, IBD, AVMs, large polyps, and malignancy.  Check TSH, celiac serologies, and urea breath test. Start Protonix 40 mg daily after urea breath test is completed. Proceed with TCS +/- EGD for rectal bleeding and history of IDA in the near future with Dr. Gala Romney. The risks, benefits, and alternatives have been discussed in detail with patient. They have stated understanding and desire to proceed.  Follow-up after procedures.

## 2019-04-08 NOTE — Assessment & Plan Note (Signed)
38 year old female who reports about 3 episodes of tissue hematochezia within the last year which is new for patient.  Reports this was in the setting of rare constipation with known hemorrhoids that prolapse at times.  Typically she has 3-4 Bristol 5-6 bowel movements daily as discussed below under "loose stools."  Also reports history of iron deficiency anemia with hemoglobin 8.5 last year requiring daily iron supplementation for 6 months.  Currently taking iron a couple times a week with most recent hemoglobin 14.17 March 2019.  Suspect rectal bleeding is likely secondary to a benign anorectal source; however, with history of iron deficiency anemia as well as loose stools, I cannot rule out IBD or other etiology including AVMs, colon polyps, or malignancy.  Proceed with TCS with Dr. Gala Romney in the near future. The risks, benefits, and alternatives have been discussed in detail with patient. They have stated understanding and desire to proceed.  Follow-up after procedures.

## 2019-04-08 NOTE — Assessment & Plan Note (Addendum)
38 year old female who reports she has been told she has IBS for several years.  She underwent cholecystectomy in 2014; however, she had loose bowels prior to this.  Currently with 3-4 Bristol 5-6 stools daily.  Often postprandial with associated abdominal cramping and urgency.   Abdominal cramping resolves after BM. Some worsening of symptoms with fried foods, steak, pork, and pasta. Denies nocturnal stools.  Rare constipation.  She does admit to about 3 episodes of tissue hematochezia over the last year in the setting of constipation.  This was new.  Reports hemorrhoids that prolapse at times.  Also reporting history of iron deficiency anemia with hemoglobin 8.5 last year requiring daily iron supplementation for 6 months.  Currently taking iron about twice a week with most recent hemoglobin 14.3 in November 2020.  No family history of colon cancer or IBD.  Suspect loose stools are likely secondary to IBS and postcholecystectomy state.  She may also have underlying food intolerances including lactose intolerance versus celiac disease.  With reported tissue hematochezia and history of iron deficiency anemia, cannot rule out IBD.  Check TSH and celiac serologies today. Advise she avoid known dietary triggers of loose stools and follow a lactose-free diet for the next 2 weeks to see if this helps with her diarrhea.  Handout provided.  Requested she call with progress report in 2 weeks. Proceed with TCS with Dr. Gala Romney in the near future for rectal bleeding and history of IDA.  This will help evaluate her loose stools as well. Hold iron x7 days prior to procedure. The risks, benefits, and alternatives have been discussed in detail with patient. They have stated understanding and desire to proceed.  Follow-up after procedure.

## 2019-04-08 NOTE — Patient Instructions (Addendum)
Please have your labs completed.  We will call you with results.  We will get you scheduled for a colonoscopy and possible upper endoscopy with Dr. Jena Gaussourk in the near future. You will need to hold your iron for 7 days prior to your procedures.  I am sending in Protonix 40 mg.  You should take this once a day 30 minutes before breakfast.  Do not start this medication until you have completed the breath test to check for H. Pylori.  Please avoid known dietary triggers for your upper abdominal pain and diarrhea including fried and fatty foods.  Please try following a lactose-free diet for the next 2 weeks to see if this helps with your diarrhea.  You may also take Lactaid tablets prior to your meal if you are going to possibly consume dairy.  See handout below.  Please call me in 2 weeks to let me know how you are doing.  If your diarrhea/loose stools continue, we can try Bentyl as discussed today.  We will plan to follow-up with you in the office after your procedures.  Ermalinda MemosKristen Xzaviar Maloof, PA-C [facility]   Lactose-Free Diet, Adult If you have lactose intolerance, you are not able to digest lactose. Lactose is a natural sugar found mainly in dairy milk and dairy products. You may need to avoid all foods and beverages that contain lactose. A lactose-free diet can help you do this. Which foods have lactose? Lactose is found in dairy milk and dairy products, such as:  Yogurt.  Cheese.  Butter.  Margarine.  Sour cream.  Cream.  Whipped toppings and nondairy creamers.  Ice cream and other dairy-based desserts. Lactose is also found in foods or products made with dairy milk or milk ingredients. To find out whether a food contains dairy milk or a milk ingredient, look at the ingredients list. Avoid foods with the statement "May contain milk" and foods that contain:  Milk powder.  Whey.  Curd.  Caseinate.  Lactose.  Lactalbumin.  Lactoglobulin. What are  alternatives to dairy milk and foods made with milk products?  Lactose-free milk.  Soy milk with added calcium and vitamin D.  Almond milk, coconut milk, rice milk, or other nondairy milk alternatives with added calcium and vitamin D. Note that these are low in protein.  Soy products, such as soy yogurt, soy cheese, soy ice cream, and soy-based sour cream.  Other nut milk products, such as almond yogurt, almond cheese, cashew yogurt, cashew cheese, cashew ice cream, coconut yogurt, and coconut ice cream. What are tips for following this plan?  Do not consume foods, beverages, vitamins, minerals, or medicines containing lactose. Read ingredient lists carefully.  Look for the words "lactose-free" on labels.  Use lactase enzyme drops or tablets as directed by your health care provider.  Use lactose-free milk or a milk alternative, such as soy milk or almond milk, for drinking and cooking.  Make sure you get enough calcium and vitamin D in your diet. A lactose-free eating plan can be lacking in these important nutrients.  Take calcium and vitamin D supplements as directed by your health care provider. Talk to your health care provider about supplements if you are not able to get enough calcium and vitamin D from food. What foods can I eat?  Fruits All fresh, canned, frozen, or dried fruits that are not processed with lactose. Vegetables All fresh, frozen, and canned vegetables without cheese, cream, or butter sauces. Grains Any that are not made with dairy milk  or dairy products. Meats and other proteins Any meat, fish, poultry, and other protein sources that are not made with dairy milk or dairy products. Soy cheese and yogurt. Fats and oils Any that are not made with dairy milk or dairy products. Beverages Lactose-free milk. Soy, rice, or almond milk with added calcium and vitamin D. Fruit and vegetable juices. Sweets and desserts Any that are not made with dairy milk or dairy  products. Seasonings and condiments Any that are not made with dairy milk or dairy products. Calcium Calcium is found in many foods that contain lactose and is important for bone health. The amount of calcium you need depends on your age:  Adults younger than 50 years: 1,000 mg of calcium a day.  Adults older than 50 years: 1,200 mg of calcium a day. If you are not getting enough calcium, you may get it from other sources, including:  Orange juice with calcium added. There are 300-350 mg of calcium in 1 cup of orange juice.  Calcium-fortified soy milk. There are 300-400 mg of calcium in 1 cup of calcium-fortified soy milk.  Calcium-fortified rice or almond milk. There are 300 mg of calcium in 1 cup of calcium-fortified rice or almond milk.  Calcium-fortified breakfast cereals. There are 100-1,000 mg of calcium in calcium-fortified breakfast cereals.  Spinach, cooked. There are 145 mg of calcium in  cup of cooked spinach.  Edamame, cooked. There are 130 mg of calcium in  cup of cooked edamame.  Collard greens, cooked. There are 125 mg of calcium in  cup of cooked collard greens.  Kale, frozen or cooked. There are 90 mg of calcium in  cup of cooked or frozen kale.  Almonds. There are 95 mg of calcium in  cup of almonds.  Broccoli, cooked. There are 60 mg of calcium in 1 cup of cooked broccoli. The items listed above may not be a complete list of recommended foods and beverages. Contact a dietitian for more options. What foods are not recommended? Fruits None, unless they are made with dairy milk or dairy products. Vegetables None, unless they are made with dairy milk or dairy products. Grains Any grains that are made with dairy milk or dairy products. Meats and other proteins None, unless they are made with dairy milk or dairy products. Dairy All dairy products, including milk, goat's milk, buttermilk, kefir, acidophilus milk, flavored milk, evaporated milk, condensed  milk, dulce de [place], eggnog, yogurt, cheese, and cheese spreads. Fats and oils Any that are made with milk or milk products. Margarines and salad dressings that contain milk or cheese. Cream. Half and half. Cream cheese. Sour cream. Chip dips made with sour cream or yogurt. Beverages Hot chocolate. Cocoa with lactose. Instant iced teas. Powdered fruit drinks. Smoothies made with dairy milk or yogurt. Sweets and desserts Any that are made with milk or milk products. Seasonings and condiments Chewing gum that has lactose. Spice blends if they contain lactose. Artificial sweeteners that contain lactose. Nondairy creamers. The items listed above may not be a complete list of foods and beverages to avoid. Contact a dietitian for more information. Summary  If you are lactose intolerant, it means that you have a hard time digesting lactose, a natural sugar found in milk and milk products.  Following a lactose-free diet can help you manage this condition.  Calcium is important for bone health and is found in many foods that contain lactose. Talk with your health care provider about other sources of calcium. This  information is not intended to replace advice given to you by your health care provider. Make sure you discuss any questions you have with your health care provider. Document Released: 10/20/2001 Document Revised: 05/28/2017 Document Reviewed: 05/28/2017 Elsevier Patient Education  2020 [employer].

## 2019-04-08 NOTE — Assessment & Plan Note (Addendum)
38 year old female who is status post cholecystectomy in 2014 now with a few year history of epigastric abdominal pain that shoots to to her back, epigastric burning/gnawing sensation, and nausea.  Pain occurs about once a week and will last most of the day to a couple of days, worse when eating, gnawing sensation is much less frequent, nausea is present to some degree most days.  Reports symptoms are not severe and she can continue to function normally. She denies vomiting, GERD symptoms, dysphagia, unintentional weight loss, or melena.  Admits to about 3 episodes of tissue hematochezia within the last year which is new.  Also reports history of IDA with hemoglobin 8.5 last year.  She was on daily iron for 6 months and now only taking iron a couple times a week with most recent hemoglobin 14.3 in November 2020.  Ibuprofen a few times a month.  No significant tenderness to palpation on abdominal exam today.  Suspect gastritis, duodenitis, vs H. Pylori.  We will check urea breath test as patient is not on any acid suppression medications at this time. Start Protonix 40 mg daily after urea breath test is completed. We will likely have EGD at the time of colonoscopy for evaluation of reported IDA especially if her epigastric symptoms continue despite PPI therapy. Risks, benefits, and alternatives have been discussed in detail with patient. They have stated understanding and desire to proceed.  Follow-up after procedures.

## 2019-04-10 LAB — TSH: TSH: 2 mIU/L

## 2019-04-10 LAB — IGA: Immunoglobulin A: 224 mg/dL (ref 47–310)

## 2019-04-10 LAB — TISSUE TRANSGLUTAMINASE, IGA: (tTG) Ab, IgA: 1 U/mL

## 2019-04-10 LAB — H. PYLORI BREATH TEST: H. pylori Breath Test: NOT DETECTED

## 2019-04-11 NOTE — Progress Notes (Signed)
Thyroid function is normal. No evidence of celiac disease. No H. pylori.   She may start taking Protonix 40 mg daily 30 minutes before breakfast if she hasn't already.  She should also be following a lactose free diet as recommended at the time of her OV. She should call is in another week or so and let us know if this is helping with her diarrhea.

## 2019-04-21 ENCOUNTER — Other Ambulatory Visit: Payer: Self-pay

## 2019-04-21 NOTE — Patient Instructions (Signed)
Called [employer] ([facility]) and spoke to [place], [place] for TCS/-/+EGD approved. Auth# [id number], valid for date of service 06/19/19.

## 2019-06-01 ENCOUNTER — Telehealth: Payer: Self-pay

## 2019-06-01 NOTE — Telephone Encounter (Signed)
Spoke to pt, TCS/-/+EGD for 06/19/19 moved up to 7:30am. New orders entered. Endo scheduler informed.

## 2019-06-01 NOTE — Telephone Encounter (Signed)
TCS/-/+EGD scheduled for 06/19/19 needs to be moved up to morning. Tried to call pt, no answer, LMOVM for return call.

## 2019-06-01 NOTE — Telephone Encounter (Signed)
Called and informed pt of pre-op and COVID test appt 06/17/19.

## 2019-06-03 ENCOUNTER — Telehealth: Payer: Self-pay | Admitting: Internal Medicine

## 2019-06-03 NOTE — Telephone Encounter (Signed)
[phone] PATIENT CALLED INQUIRING ON WEATHER HER UPCOMING PROCEDURE WOULD ALLOW THE DOCTOR TO SEE HER BILE DUCT? SAID SHE WAS FOR A TCS AND POSSIBLE ENDO

## 2019-06-03 NOTE — Telephone Encounter (Signed)
No, bile ducts will not be visible during procedures.

## 2019-06-03 NOTE — Telephone Encounter (Signed)
Called and informed pt, she is having pain like she was prior to being seen. She feels like pain is in her bile duct. Advised pt, see what procedure shows. Provider will address further if needed.

## 2019-06-03 NOTE — Telephone Encounter (Signed)
Routing to [facility] for advice. 

## 2019-06-16 NOTE — Patient Instructions (Signed)
SHALLYN Bryant  06/16/2019     @PREFPERIOPPHARMACY @   Your procedure is scheduled on  06/19/2019   Report to [facility] at  [email or web address].  Call this number if you have problems the morning of surgery:  [phone]   Remember:  Follow the diet and prep instructions given to you by Dr Roseanne Kaufman office.                    Take these medicines the morning of surgery with A SIP OF WATER  protonix.    Do not wear jewelry, make-up or nail polish.  Do not wear lotions, powders, or perfumes. Please wear deodorant and brush your teeth.  Do not shave 48 hours prior to surgery.  Men may shave face and neck.  Do not bring valuables to the hospital.  [facility] is not responsible for any belongings or valuables.  Contacts, dentures or bridgework may not be worn into surgery.  Leave your suitcase in the car.  After surgery it may be brought to your room.  For patients admitted to the hospital, discharge time will be determined by your treatment team.  Patients discharged the day of surgery will not be allowed to drive home.   Name and phone number of your driver:   [email or web address] Special instructions:  None  Please read over the following fact sheets that you were given. Anesthesia Post-op Instructions and Care and Recovery After Surgery       Upper Endoscopy, Adult, Care After This sheet gives you information about how to care for yourself after your procedure. Your health care provider may also give you more specific instructions. If you have problems or questions, contact your health care provider. What can I expect after the procedure? After the procedure, it is common to have:  A sore throat.  Mild stomach pain or discomfort.  Bloating.  Nausea. Follow these instructions at home:   Follow instructions from your health care provider about what to eat or drink after your procedure.  Return to your normal activities as told by your health care provider. Ask your health care  provider what activities are safe for you.  Take over-the-counter and prescription medicines only as told by your health care provider.  Do not drive for 24 hours if you were given a sedative during your procedure.  Keep all follow-up visits as told by your health care provider. This is important. Contact a health care provider if you have:  A sore throat that lasts longer than one day.  Trouble swallowing. Get help right away if:  You vomit blood or your vomit looks like coffee grounds.  You have: ? A fever. ? Bloody, black, or tarry stools. ? A severe sore throat or you cannot swallow. ? Difficulty breathing. ? Severe pain in your chest or abdomen. Summary  After the procedure, it is common to have a sore throat, mild stomach discomfort, bloating, and nausea.  Do not drive for 24 hours if you were given a sedative during the procedure.  Follow instructions from your health care provider about what to eat or drink after your procedure.  Return to your normal activities as told by your health care provider. This information is not intended to replace advice given to you by your health care provider. Make sure you discuss any questions you have with your health care provider. Document Revised: 10/22/2017 Document Reviewed: 09/30/2017 Elsevier Patient Education  [place].  Colonoscopy, Adult, Care After This sheet gives you information about how to care for yourself after your procedure. Your health care provider may also give you more specific instructions. If you have problems or questions, contact your health care provider. What can I expect after the procedure? After the procedure, it is common to have:  A small amount of blood in your stool for 24 hours after the procedure.  Some gas.  Mild cramping or bloating of your abdomen. Follow these instructions at home: Eating and drinking   Drink enough fluid to keep your urine pale yellow.  Follow  instructions from your health care provider about eating or drinking restrictions.  Resume your normal diet as instructed by your health care provider. Avoid heavy or fried foods that are hard to digest. Activity  Rest as told by your health care provider.  Avoid sitting for a long time without moving. Get up to take short walks every 1-2 hours. This is important to improve blood flow and breathing. Ask for help if you feel weak or unsteady.  Return to your normal activities as told by your health care provider. Ask your health care provider what activities are safe for you. Managing cramping and bloating   Try walking around when you have cramps or feel bloated.  Apply heat to your abdomen as told by your health care provider. Use the heat source that your health care provider recommends, such as a moist heat pack or a heating pad. ? Place a towel between your skin and the heat source. ? Leave the heat on for 20-30 minutes. ? Remove the heat if your skin turns bright red. This is especially important if you are unable to feel pain, heat, or cold. You may have a greater risk of getting burned. General instructions  For the first 24 hours after the procedure: ? Do not drive or use machinery. ? Do not sign important documents. ? Do not drink alcohol. ? Do your regular daily activities at a slower pace than normal. ? Eat soft foods that are easy to digest.  Take over-the-counter and prescription medicines only as told by your health care provider.  Keep all follow-up visits as told by your health care provider. This is important. Contact a health care provider if:  You have blood in your stool 2-3 days after the procedure. Get help right away if you have:  More than a small spotting of blood in your stool.  Large blood clots in your stool.  Swelling of your abdomen.  Nausea or vomiting.  A fever.  Increasing pain in your abdomen that is not relieved with  medicine. Summary  After the procedure, it is common to have a small amount of blood in your stool. You may also have mild cramping and bloating of your abdomen.  For the first 24 hours after the procedure, do not drive or use machinery, sign important documents, or drink alcohol.  Get help right away if you have a lot of blood in your stool, nausea or vomiting, a fever, or increased pain in your abdomen. This information is not intended to replace advice given to you by your health care provider. Make sure you discuss any questions you have with your health care provider. Document Revised: 11/24/2018 Document Reviewed: 11/24/2018 Elsevier Patient Education  [place] After These instructions provide you with information about caring for yourself after your procedure. Your health care provider may also  give you more specific instructions. Your treatment has been planned according to current medical practices, but problems sometimes occur. Call your health care provider if you have any problems or questions after your procedure. What can I expect after the procedure? After your procedure, you may:  Feel sleepy for several hours.  Feel clumsy and have poor balance for several hours.  Feel forgetful about what happened after the procedure.  Have poor judgment for several hours.  Feel nauseous or vomit.  Have a sore throat if you had a breathing tube during the procedure. Follow these instructions at home: For at least 24 hours after the procedure:      Have a responsible adult stay with you. It is important to have someone help care for you until you are awake and alert.  Rest as needed.  Do not: ? Participate in activities in which you could fall or become injured. ? Drive. ? Use heavy machinery. ? Drink alcohol. ? Take sleeping pills or medicines that cause drowsiness. ? Make important decisions or sign legal documents. ? Take care  of children on your own. Eating and drinking  Follow the diet that is recommended by your health care provider.  If you vomit, drink water, juice, or soup when you can drink without vomiting.  Make sure you have little or no nausea before eating solid foods. General instructions  Take over-the-counter and prescription medicines only as told by your health care provider.  If you have sleep apnea, surgery and certain medicines can increase your risk for breathing problems. Follow instructions from your health care provider about wearing your sleep device: ? Anytime you are sleeping, including during daytime naps. ? While taking prescription pain medicines, sleeping medicines, or medicines that make you drowsy.  If you smoke, do not smoke without supervision.  Keep all follow-up visits as told by your health care provider. This is important. Contact a health care provider if:  You keep feeling nauseous or you keep vomiting.  You feel light-headed.  You develop a rash.  You have a fever. Get help right away if:  You have trouble breathing. Summary  For several hours after your procedure, you may feel sleepy and have poor judgment.  Have a responsible adult stay with you for at least 24 hours or until you are awake and alert. This information is not intended to replace advice given to you by your health care provider. Make sure you discuss any questions you have with your health care provider. Document Revised: 07/29/2017 Document Reviewed: 08/21/2015 Elsevier Patient Education  2020 [employer].

## 2019-06-17 ENCOUNTER — Other Ambulatory Visit: Payer: Self-pay

## 2019-06-17 ENCOUNTER — Encounter (HOSPITAL_COMMUNITY)
Admission: RE | Admit: 2019-06-17 | Discharge: 2019-06-17 | Disposition: A | Discharge status: Home / Self Care | Payer: No Typology Code available for payment source | Source: Ambulatory Visit | Attending: Internal Medicine | Admitting: Internal Medicine

## 2019-06-17 ENCOUNTER — Encounter (HOSPITAL_COMMUNITY): Payer: Self-pay

## 2019-06-17 ENCOUNTER — Other Ambulatory Visit (HOSPITAL_COMMUNITY)
Admission: RE | Admit: 2019-06-17 | Discharge: 2019-06-17 | Disposition: A | Discharge status: Home / Self Care | Payer: No Typology Code available for payment source | Source: Ambulatory Visit | Attending: Internal Medicine | Admitting: Internal Medicine

## 2019-06-17 DIAGNOSIS — Z01812 Encounter for preprocedural laboratory examination: Secondary | ICD-10-CM | POA: Diagnosis present

## 2019-06-17 DIAGNOSIS — Z20822 Contact with and (suspected) exposure to covid-19: Secondary | ICD-10-CM | POA: Diagnosis not present

## 2019-06-17 LAB — CBC WITH DIFFERENTIAL/PLATELET
Abs Immature Granulocytes: 0.02 10*3/uL (ref 0.00–0.07)
Basophils Absolute: 0 10*3/uL (ref 0.0–0.1)
Basophils Relative: 0 %
Eosinophils Absolute: 0.1 10*3/uL (ref 0.0–0.5)
Eosinophils Relative: 1 %
HCT: 44.3 % (ref 36.0–46.0)
Hemoglobin: 14.2 g/dL (ref 12.0–15.0)
Immature Granulocytes: 0 %
Lymphocytes Relative: 23 %
Lymphs Abs: 1.7 10*3/uL (ref 0.7–4.0)
MCH: 28.2 pg (ref 26.0–34.0)
MCHC: 32.1 g/dL (ref 30.0–36.0)
MCV: 87.9 fL (ref 80.0–100.0)
Monocytes Absolute: 0.4 10*3/uL (ref 0.1–1.0)
Monocytes Relative: 5 %
Neutro Abs: 5.2 10*3/uL (ref 1.7–7.7)
Neutrophils Relative %: 71 %
Platelets: 209 10*3/uL (ref 150–400)
RBC: 5.04 MIL/uL (ref 3.87–5.11)
RDW: 11.7 % (ref 11.5–15.5)
WBC: 7.4 10*3/uL (ref 4.0–10.5)
nRBC: 0 % (ref 0.0–0.2)

## 2019-06-17 LAB — HCG, SERUM, QUALITATIVE: Preg, Serum: NEGATIVE

## 2019-06-17 LAB — SARS CORONAVIRUS 2 (TAT 6-24 HRS): SARS Coronavirus 2: NEGATIVE

## 2019-06-18 ENCOUNTER — Other Ambulatory Visit (HOSPITAL_COMMUNITY): Payer: No Typology Code available for payment source

## 2019-06-19 ENCOUNTER — Ambulatory Visit (HOSPITAL_COMMUNITY): Payer: PRIVATE HEALTH INSURANCE | Admitting: Anesthesiology

## 2019-06-19 ENCOUNTER — Encounter (HOSPITAL_COMMUNITY): Payer: Self-pay | Admitting: Internal Medicine

## 2019-06-19 ENCOUNTER — Encounter (HOSPITAL_COMMUNITY): Payer: Self-pay

## 2019-06-19 ENCOUNTER — Ambulatory Visit (HOSPITAL_COMMUNITY): Admit: 2019-06-19 | Payer: No Typology Code available for payment source | Admitting: Internal Medicine

## 2019-06-19 ENCOUNTER — Ambulatory Visit (HOSPITAL_COMMUNITY)
Admission: RE | Admit: 2019-06-19 | Discharge: 2019-06-19 | Disposition: A | Discharge status: Home / Self Care | Payer: PRIVATE HEALTH INSURANCE | Attending: Internal Medicine | Admitting: Internal Medicine

## 2019-06-19 ENCOUNTER — Encounter (HOSPITAL_COMMUNITY)
Admission: RE | Disposition: A | Discharge status: Home / Self Care | Payer: Self-pay | Source: Home / Self Care | Attending: Internal Medicine

## 2019-06-19 DIAGNOSIS — K648 Other hemorrhoids: Secondary | ICD-10-CM | POA: Insufficient documentation

## 2019-06-19 DIAGNOSIS — R1310 Dysphagia, unspecified: Secondary | ICD-10-CM | POA: Diagnosis not present

## 2019-06-19 DIAGNOSIS — R1314 Dysphagia, pharyngoesophageal phase: Secondary | ICD-10-CM | POA: Insufficient documentation

## 2019-06-19 DIAGNOSIS — Z880 Allergy status to penicillin: Secondary | ICD-10-CM | POA: Insufficient documentation

## 2019-06-19 DIAGNOSIS — K921 Melena: Secondary | ICD-10-CM | POA: Diagnosis not present

## 2019-06-19 DIAGNOSIS — K317 Polyp of stomach and duodenum: Secondary | ICD-10-CM | POA: Insufficient documentation

## 2019-06-19 DIAGNOSIS — R1011 Right upper quadrant pain: Secondary | ICD-10-CM | POA: Diagnosis present

## 2019-06-19 DIAGNOSIS — K219 Gastro-esophageal reflux disease without esophagitis: Secondary | ICD-10-CM | POA: Insufficient documentation

## 2019-06-19 HISTORY — PX: MALONEY DILATION: SHX5535

## 2019-06-19 HISTORY — PX: ESOPHAGOGASTRODUODENOSCOPY (EGD) WITH PROPOFOL: SHX5813

## 2019-06-19 HISTORY — PX: POLYPECTOMY: SHX5525

## 2019-06-19 HISTORY — PX: COLONOSCOPY WITH PROPOFOL: SHX5780

## 2019-06-19 SURGERY — COLONOSCOPY
Anesthesia: Moderate Sedation

## 2019-06-19 SURGERY — COLONOSCOPY WITH PROPOFOL
Anesthesia: General

## 2019-06-19 MED ORDER — PROMETHAZINE HCL 25 MG/ML IJ SOLN: 6.2500 mg | INTRAMUSCULAR | Status: DC | PRN

## 2019-06-19 MED ORDER — LIDOCAINE HCL (CARDIAC) PF 100 MG/5ML IV SOSY
PREFILLED_SYRINGE | INTRAVENOUS | Status: DC | PRN
  Administered 2019-06-19: 40 mg via INTRATRACHEAL

## 2019-06-19 MED ORDER — PROPOFOL 500 MG/50ML IV EMUL
INTRAVENOUS | Status: DC | PRN
  Administered 2019-06-19: 125 ug/kg/min via INTRAVENOUS

## 2019-06-19 MED ORDER — KETAMINE HCL 50 MG/5ML IJ SOSY
PREFILLED_SYRINGE | INTRAMUSCULAR | Status: AC
  Filled 2019-06-19: qty 5

## 2019-06-19 MED ORDER — CHLORHEXIDINE GLUCONATE CLOTH 2 % EX PADS: 6.0000 | MEDICATED_PAD | Freq: Once | CUTANEOUS | Status: DC

## 2019-06-19 MED ORDER — STERILE WATER FOR IRRIGATION IR SOLN
Status: DC | PRN
  Administered 2019-06-19: 2.5 mL

## 2019-06-19 MED ORDER — LACTATED RINGERS IV SOLN: INTRAVENOUS | Status: DC | PRN

## 2019-06-19 MED ORDER — GLYCOPYRROLATE 0.2 MG/ML IJ SOLN
INTRAMUSCULAR | Status: DC | PRN
  Administered 2019-06-19: .1 mg via INTRAVENOUS

## 2019-06-19 MED ORDER — MIDAZOLAM HCL 2 MG/2ML IJ SOLN: 0.5000 mg | Freq: Once | INTRAMUSCULAR | Status: DC | PRN

## 2019-06-19 MED ORDER — HYDROCODONE-ACETAMINOPHEN 7.5-325 MG PO TABS: 1.0000 | ORAL_TABLET | Freq: Once | ORAL | Status: DC | PRN

## 2019-06-19 MED ORDER — LACTATED RINGERS IV SOLN
INTRAVENOUS | Status: DC
  Administered 2019-06-19: 1000 mL via INTRAVENOUS

## 2019-06-19 MED ORDER — KETAMINE HCL 10 MG/ML IJ SOLN
INTRAMUSCULAR | Status: DC | PRN
  Administered 2019-06-19: 5 mg via INTRAVENOUS
  Administered 2019-06-19: 25 mg via INTRAVENOUS

## 2019-06-19 MED ORDER — PROPOFOL 10 MG/ML IV BOLUS
INTRAVENOUS | Status: DC | PRN
  Administered 2019-06-19 (×2): 20 mg via INTRAVENOUS

## 2019-06-19 MED ORDER — HYDROMORPHONE HCL 1 MG/ML IJ SOLN: 0.2500 mg | INTRAMUSCULAR | Status: DC | PRN

## 2019-06-19 NOTE — H&P (Signed)
@LOGO @   Primary Care Physician:  , MD Primary Gastroenterologist:  Dr. Assunta Found Pre-Procedure History & Physical: HPI:  Vanessa Bryant is a 39 y.o. female here for further evaluation of epigastric pain vague esophageal dysphagia intermittent rectal bleeding.  Bowel dysfunction characterizes constipation alternating with diarrhea.  Sometimes goes a day without a bowel movement.  Protonix has helped reflux symptoms but not right upper quadrant abdominal pain.  Past Medical History:  Diagnosis Date  . Anxiety     Past Surgical History:  Procedure Laterality Date  . CHOLECYSTECTOMY N/A 02/16/2013   Procedure: LAPAROSCOPIC CHOLECYSTECTOMY WITH INTRAOPERATIVE CHOLANGIOGRAM;  Surgeon: 04/18/2013, MD;  Location: [facility] OR;  Service: General;  Laterality: N/A;  . WISDOM TOOTH EXTRACTION      Prior to Admission medications   Medication Sig Start Date End Date Taking? Authorizing Provider  pantoprazole (PROTONIX) 40 MG tablet Take 1 tablet (40 mg total) by mouth daily. 04/08/19  Yes 04/10/19 S, PA-C  polyethylene glycol-electrolytes (TRILYTE) 420 g solution Take 4,000 mLs by mouth as directed. 04/08/19  Yes 04/10/19, MD    Allergies as of 06/01/2019 - Review Complete 04/08/2019  Allergen Reaction Noted  . Penicillins Rash 08/27/2012    Family History  Problem Relation Age of Onset  . Heart disease Mother   . Hypertension Mother   . Hypertension Father   . Heart attack Maternal Grandmother   . Heart attack Maternal Grandfather   . Diabetes Paternal Grandfather   . Diabetes Paternal Grandmother   . Colon cancer Neg Hx   . Inflammatory bowel disease Neg Hx     Social History   Socioeconomic History  . Marital status: Married    Spouse name: Not on file  . Number of children: Not on file  . Years of education: Not on file  . Highest education level: Not on file  Occupational History  . Not on file  Tobacco Use  . Smoking status: Never Smoker  .  Smokeless tobacco: Never Used  Substance and Sexual Activity  . Alcohol use: No  . Drug use: No  . Sexual activity: Yes    Birth control/protection: Surgical    Comment: vasectomy   Other Topics Concern  . Not on file  Social History Narrative  . Not on file   Social Determinants of Health   Financial Resource Strain:   . Difficulty of Paying Living Expenses: Not on file  Food Insecurity:   . Worried About 08/29/2012 in the Last Year: Not on file  . Ran Out of Food in the Last Year: Not on file  Transportation Needs:   . Lack of Transportation (Medical): Not on file  . Lack of Transportation (Non-Medical): Not on file  Physical Activity:   . Days of Exercise per Week: Not on file  . Minutes of Exercise per Session: Not on file  Stress:   . Feeling of Stress : Not on file  Social Connections:   . Frequency of Communication with Friends and Family: Not on file  . Frequency of Social Gatherings with Friends and Family: Not on file  . Attends Religious Services: Not on file  . Active Member of Clubs or Organizations: Not on file  . Attends Programme researcher, broadcasting/film/video Meetings: Not on file  . Marital Status: Not on file  Intimate Partner Violence:   . Fear of Current or Ex-Partner: Not on file  . Emotionally Abused: Not on file  . Physically Abused:  Not on file  . Sexually Abused: Not on file    Review of Systems: See HPI, otherwise negative ROS  Physical Exam: BP 121/86   Temp 98.4 F (36.9 C) (Oral)   Resp 18   Ht 5\' 2"  (1.575 m)   Wt 54 kg   SpO2 100%   BMI 21.77 kg/m  General:   Alert,  Well-developed, well-nourished, pleasant and cooperative in NAD Neck:  Supple; no masses or thyromegaly. No significant cervical adenopathy. Lungs:  Clear throughout to auscultation.   No wheezes, crackles, or rhonchi. No acute distress. Heart:  Regular rate and rhythm; no murmurs, clicks, rubs,  or gallops. Abdomen: Non-distended, normal bowel sounds.  Soft and nontender  without appreciable mass or hepatosplenomegaly.  Pulses:  Normal pulses noted. Extremities:  Without clubbing or edema.  Impression/Plan: 39 year old lady with right upper quadrant abdominal pain GERD now well controlled on PPI.  Intermittent vague esophageal dysphagia.  Intermittent rectal bleeding.  Altered bowel function most consistent with irritable bowel syndrome  Recommendations: I have offered the patient an EGD with esophageal dilation as feasible/appropriate per plan.  Also, diagnostic colonoscopy.  The risks, benefits, limitations, imponderables and alternatives regarding both EGD and colonoscopy have been reviewed with the patient. Questions have been answered. All parties agreeable.      Notice: This dictation was prepared with Dragon dictation along with smaller phrase technology. Any transcriptional errors that result from this process are unintentional and may not be corrected upon review.

## 2019-06-19 NOTE — Transfer of Care (Signed)
Immediate Anesthesia Transfer of Care Note  Patient: Vanessa Bryant  Procedure(s) Performed: COLONOSCOPY WITH PROPOFOL (N/A ) ESOPHAGOGASTRODUODENOSCOPY (EGD) WITH PROPOFOL (N/A ) MALONEY DILATION POLYPECTOMY  Patient Location: PACU  Anesthesia Type:General  Level of Consciousness: awake  Airway & Oxygen Therapy: Patient Spontanous Breathing  Post-op Assessment: Report given to RN and Post -op Vital signs reviewed and stable  Post vital signs: Reviewed and stable  Last Vitals:  Vitals Value Taken Time  BP    Temp    Pulse 154 06/19/19 0821  Resp 19 06/19/19 0821  SpO2 82 % 06/19/19 0821  Vitals shown include unvalidated device data.  Last Pain:  Vitals:   06/19/19 0639  TempSrc: Oral  PainSc: 0-No pain      Patients Stated Pain Goal: 6 (06/19/19 4098)  Complications: No apparent anesthesia complications

## 2019-06-19 NOTE — Op Note (Signed)
[facility] Patient Name: Vanessa Bryant Procedure Date: 06/19/2019 7:04 AM MRN: [id number] Date of Birth: Sep 26, 1980 Attending MD: Norvel Richards , MD CSN: [id number] Age: 39 Admit Type: Outpatient Procedure:                Upper GI endoscopy Indications:              Abdominal pain in the right upper quadrant,                            Dysphagia celiac screen negative. CBC completely                            normal 3 days ago Providers:                Norvel Richards, MD, Lurline Del, RN, Raphael Gibney, Technician Referring MD:             Halford Chessman MD, MD Medicines:                Propofol per Anesthesia Complications:            No immediate complications. Estimated Blood Loss:     Estimated blood loss was minimal. Procedure:                Pre-Anesthesia Assessment:                           - Prior to the procedure, a History and Physical                            was performed, and patient medications and                            allergies were reviewed. The patient's tolerance of                            previous anesthesia was also reviewed. The risks                            and benefits of the procedure and the sedation                            options and risks were discussed with the patient.                            All questions were answered, and informed consent                            was obtained. Prior Anticoagulants: The patient has                            taken no previous anticoagulant or antiplatelet  agents. ASA Grade Assessment: II - A patient with                            mild systemic disease. After reviewing the risks                            and benefits, the patient was deemed in                            satisfactory condition to undergo the procedure.                           After obtaining informed consent, the endoscope was   passed under direct vision. Throughout the                            procedure, the patient's blood pressure, pulse, and                            oxygen saturations were monitored continuously. The                            GIF-[id number] ([id number]) was introduced through the                            mouth, and advanced to the second part of duodenum.                            The upper GI endoscopy was accomplished without                            difficulty. The patient tolerated the procedure                            well. Scope In: 7:46:34 AM Scope Out: 7:56:38 AM Total Procedure Duration: 0 hours 10 minutes 4 seconds  Findings:      The examined esophagus was normal.      A single 4 mm sessile polyp was found in the cardia.      The exam was otherwise without abnormality.      The duodenal bulb and second portion of the duodenum were normal.       Finally, the polyp in the cardia was removed with cold biopsy forceps.       The scope was withdrawn. Dilation was performed with a Maloney dilator       with mild resistance at 52 Fr. The dilation site was examined following       endoscope reinsertion and showed no change. Estimated blood loss: none. Impression:               - Normal esophagus. Dilated.                           - A single gastric polyp. Removed                           -  The examination was otherwise normal.                           - Normal duodenal bulb and second portion of the                            duodenum.                           - Moderate Sedation:      Moderate (conscious) sedation was personally administered by an       anesthesia professional. The following parameters were monitored: oxygen       saturation, heart rate, blood pressure, respiratory rate, EKG, adequacy       of pulmonary ventilation, and response to care. Recommendation:           - Patient has a contact number available for                            emergencies. The signs and  symptoms of potential                            delayed complications were discussed with the                            patient. Return to normal activities tomorrow.                            Written discharge instructions were provided to the                            patient.                           - Advance diet as tolerated.                           - Continue present medications. Follow-up on                            pathology.                           - Return to GI clinic in 6 weeks. See EGD report Procedure Code(s):        --- Professional ---                           [id number], Esophagogastroduodenoscopy, flexible,                            transoral; diagnostic, including collection of                            specimen(s) by brushing or washing, when performed                            (separate procedure)  43450, Dilation of esophagus, by unguided sound or                            bougie, single or multiple passes Diagnosis Code(s):        --- Professional ---                           K31.7, Polyp of stomach and duodenum                           R10.11, Right upper quadrant pain                           R13.10, Dysphagia, unspecified CPT copyright 2019 American Medical Association. All rights reserved. The codes documented in this report are preliminary and upon coder review may  be revised to meet current compliance requirements. Gerrit Friends. Trinda Harlacher, MD Gennette Pac, MD 06/19/2019 8:15:49 AM This report has been signed electronically. Number of Addenda: 0

## 2019-06-19 NOTE — Anesthesia Preprocedure Evaluation (Signed)
Anesthesia Evaluation  Patient identified by MRN, date of birth, ID band Patient awake    Reviewed: Allergy & Precautions, NPO status , Patient's Chart, lab work & pertinent test results  Airway Mallampati: I  TM Distance: >3 FB Neck ROM: Full    Dental no notable dental hx. (+) Teeth Intact   Pulmonary neg pulmonary ROS,    Pulmonary exam normal breath sounds clear to auscultation       Cardiovascular Exercise Tolerance: Good negative cardio ROS Normal cardiovascular examI Rhythm:Regular Rate:Normal     Neuro/Psych Anxiety negative neurological ROS  negative psych ROS   GI/Hepatic Neg liver ROS, Bowel prep,GERD  Medicated and Controlled,Here for EGD/Colon    Endo/Other  negative endocrine ROS  Renal/GU negative Renal ROS  negative genitourinary   Musculoskeletal negative musculoskeletal ROS (+)   Abdominal   Peds negative pediatric ROS (+)  Hematology negative hematology ROS (+) Last h/h normal    Anesthesia Other Findings   Reproductive/Obstetrics negative OB ROS                             Anesthesia Physical Anesthesia Plan  ASA: II  Anesthesia Plan: General   Post-op Pain Management:    Induction: Intravenous  PONV Risk Score and Plan: 3 and TIVA, Propofol infusion, Treatment may vary due to age or medical condition and Ondansetron  Airway Management Planned: Simple Face Mask and Nasal Cannula  Additional Equipment:   Intra-op Plan:   Post-operative Plan:   Informed Consent: I have reviewed the patients History and Physical, chart, labs and discussed the procedure including the risks, benefits and alternatives for the proposed anesthesia with the patient or authorized representative who has indicated his/her understanding and acceptance.     Dental advisory given  Plan Discussed with: CRNA  Anesthesia Plan Comments: (Plan Full PPE use  Plan GA with GETA as  needed d/w pt -WTP with same after Q&A)        Anesthesia Quick Evaluation

## 2019-06-19 NOTE — Discharge Instructions (Signed)
Colonoscopy Discharge Instructions  Read the instructions outlined below and refer to this sheet in the next few weeks. These discharge instructions provide you with general information on caring for yourself after you leave the hospital. Your doctor may also give you specific instructions. While your treatment has been planned according to the most current medical practices available, unavoidable complications occasionally occur. If you have any problems or questions after discharge, call Dr. Gala Romney at [phone]. ACTIVITY  You may resume your regular activity, but move at a slower pace for the next 24 hours.   Take frequent rest periods for the next 24 hours.   Walking will help get rid of the air and reduce the bloated feeling in your belly (abdomen).   No driving for 24 hours (because of the medicine (anesthesia) used during the test).    Do not sign any important legal documents or operate any machinery for 24 hours (because of the anesthesia used during the test).  NUTRITION  Drink plenty of fluids.   You may resume your normal diet as instructed by your doctor.   Begin with a light meal and progress to your normal diet. Heavy or fried foods are harder to digest and may make you feel sick to your stomach (nauseated).   Avoid alcoholic beverages for 24 hours or as instructed.  MEDICATIONS  You may resume your normal medications unless your doctor tells you otherwise.  WHAT YOU CAN EXPECT TODAY  Some feelings of bloating in the abdomen.   Passage of more gas than usual.   Spotting of blood in your stool or on the toilet paper.  IF YOU HAD POLYPS REMOVED DURING THE COLONOSCOPY:  No aspirin products for 7 days or as instructed.   No alcohol for 7 days or as instructed.   Eat a soft diet for the next 24 hours.  FINDING OUT THE RESULTS OF YOUR TEST Not all test results are available during your visit. If your test results are not back during the visit, make an appointment  with your caregiver to find out the results. Do not assume everything is normal if you have not heard from your caregiver or the medical facility. It is important for you to follow up on all of your test results.  SEEK IMMEDIATE MEDICAL ATTENTION IF:  You have more than a spotting of blood in your stool.   Your belly is swollen (abdominal distention).   You are nauseated or vomiting.   You have a temperature over 101.   You have abdominal pain or discomfort that is severe or gets worse throughout the day.    EGD Discharge instructions Please read the instructions outlined below and refer to this sheet in the next few weeks. These discharge instructions provide you with general information on caring for yourself after you leave the hospital. Your doctor may also give you specific instructions. While your treatment has been planned according to the most current medical practices available, unavoidable complications occasionally occur. If you have any problems or questions after discharge, please call your doctor. ACTIVITY  You may resume your regular activity but move at a slower pace for the next 24 hours.   Take frequent rest periods for the next 24 hours.   Walking will help expel (get rid of) the air and reduce the bloated feeling in your abdomen.   No driving for 24 hours (because of the anesthesia (medicine) used during the test).   You may shower.   Do not sign  any important legal documents or operate any machinery for 24 hours (because of the anesthesia used during the test).  NUTRITION  Drink plenty of fluids.   You may resume your normal diet.   Begin with a light meal and progress to your normal diet.   Avoid alcoholic beverages for 24 hours or as instructed by your caregiver.  MEDICATIONS  You may resume your normal medications unless your caregiver tells you otherwise.  WHAT YOU CAN EXPECT TODAY  You may experience abdominal discomfort such as a feeling of  fullness or "gas" pains.  FOLLOW-UP  Your doctor will discuss the results of your test with you.  SEEK IMMEDIATE MEDICAL ATTENTION IF ANY OF THE FOLLOWING OCCUR:  Excessive nausea (feeling sick to your stomach) and/or vomiting.   Severe abdominal pain and distention (swelling).   Trouble swallowing.   Temperature over 101 F (37.8 C).   Rectal bleeding or vomiting of blood.   Stomach polyp removed today  Your esophagus was dilated today.  Your colon appeared normal today you had small hemorrhoids-likely source of bleeding  No explanation for upper abdominal pain found today  You may need further evaluation  Further recommendations to follow pending review of pathology report  Office visit with [place] in 6 weeks  At patient request, I called husband at [phone] -reviewed results.     Monitored Anesthesia Care, Care After These instructions provide you with information about caring for yourself after your procedure. Your health care provider may also give you more specific instructions. Your treatment has been planned according to current medical practices, but problems sometimes occur. Call your health care provider if you have any problems or questions after your procedure. What can I expect after the procedure? After your procedure, you may:  Feel sleepy for several hours.  Feel clumsy and have poor balance for several hours.  Feel forgetful about what happened after the procedure.  Have poor judgment for several hours.  Feel nauseous or vomit.  Have a sore throat if you had a breathing tube during the procedure. Follow these instructions at home: For at least 24 hours after the procedure:      Have a responsible adult stay with you. It is important to have someone help care for you until you are awake and alert.  Rest as needed.  Do not: ? Participate in activities in which you could fall or become injured. ? Drive. ? Use heavy machinery. ? Drink  alcohol. ? Take sleeping pills or medicines that cause drowsiness. ? Make important decisions or sign legal documents. ? Take care of children on your own. Eating and drinking  Follow the diet that is recommended by your health care provider.  If you vomit, drink water, juice, or soup when you can drink without vomiting.  Make sure you have little or no nausea before eating solid foods. General instructions  Take over-the-counter and prescription medicines only as told by your health care provider.  If you have sleep apnea, surgery and certain medicines can increase your risk for breathing problems. Follow instructions from your health care provider about wearing your sleep device: ? Anytime you are sleeping, including during daytime naps. ? While taking prescription pain medicines, sleeping medicines, or medicines that make you drowsy.  If you smoke, do not smoke without supervision.  Keep all follow-up visits as told by your health care provider. This is important. Contact a health care provider if:  You keep feeling nauseous or you keep vomiting.  You feel light-headed.  You develop a rash.  You have a fever. Get help right away if:  You have trouble breathing. Summary  For several hours after your procedure, you may feel sleepy and have poor judgment.  Have a responsible adult stay with you for at least 24 hours or until you are awake and alert. This information is not intended to replace advice given to you by your health care provider. Make sure you discuss any questions you have with your health care provider. Document Revised: 07/29/2017 Document Reviewed: 08/21/2015 Elsevier Patient Education  [place].

## 2019-06-19 NOTE — Op Note (Signed)
[facility] Patient Name: Vanessa Bryant Procedure Date: 06/19/2019 8:00 AM MRN: [id number] Date of Birth: 01-25-1981 Attending MD: Gennette Pac , MD CSN: [id number] Age: 39 Admit Type: Outpatient Procedure:                Colonoscopy Indications:              Hematochezia Providers:                Gennette Pac, MD, Nena Polio, RN, Pandora Leiter, Technician Referring MD:              Medicines:                Propofol per Anesthesia Complications:            No immediate complications. Distal 5 cm terminal                            ileum appeared normal. Estimated Blood Loss:     Estimated blood loss: none. Procedure:                Pre-Anesthesia Assessment:                           - Prior to the procedure, a History and Physical                            was performed, and patient medications and                            allergies were reviewed. The patient's tolerance of                            previous anesthesia was also reviewed. The risks                            and benefits of the procedure and the sedation                            options and risks were discussed with the patient.                            All questions were answered, and informed consent                            was obtained. Prior Anticoagulants: The patient has                            taken no previous anticoagulant or antiplatelet                            agents. ASA Grade Assessment: II - A patient with                            mild  systemic disease. After reviewing the risks                            and benefits, the patient was deemed in                            satisfactory condition to undergo the procedure.                           After obtaining informed consent, the colonoscope                            was passed under direct vision. Throughout the                            procedure, the patient's blood pressure, pulse,  and                            oxygen saturations were monitored continuously. The                            CF-[id number] ([id number]) scope was introduced through                            the anus and advanced to the 5 cm into the ileum.                            The colonoscopy was performed without difficulty.                            The patient tolerated the procedure well. The                            quality of the bowel preparation was adequate. Scope In: 8:03:14 AM Scope Out: 8:12:57 AM Scope Withdrawal Time: 0 hours 7 minutes 13 seconds  Total Procedure Duration: 0 hours 9 minutes 43 seconds  Findings:      The perianal and digital rectal examinations were normal.      The colon (entire examined portion) appeared normal. Patient had anal       canal hemorrhoids. Rectal mucosa seen well on?"face. Rectum too small to       retroflex. Impression:               - The entire examined colon is normal. Anal Canal                            hemorrhoids?"likely source of trivial anorectal                            bleeding. CBC normal recently.                           - No specimens collected. Moderate Sedation:      Moderate (conscious) sedation was personally administered by an       anesthesia professional.  The following parameters were monitored: oxygen       saturation, heart rate, blood pressure, and response to care. Recommendation:           - Patient has a contact number available for                            emergencies. The signs and symptoms of potential                            delayed complications were discussed with the                            patient. Return to normal activities tomorrow.                            Written discharge instructions were provided to the                            patient.                           - Advance diet as tolerated.                           - Return to my office in 6 weeks.                           - Repeat  colonoscopy at age 24 for screening                            purposes. See EGD report. Procedure Code(s):        --- Professional ---                           [phone], Colonoscopy, flexible; diagnostic, including                            collection of specimen(s) by brushing or washing,                            when performed (separate procedure) Diagnosis Code(s):        --- Professional ---                           K92.1, Melena (includes Hematochezia) CPT copyright 2019 American Medical Association. All rights reserved. The codes documented in this report are preliminary and upon coder review may  be revised to meet current compliance requirements. Cristopher Estimable. Delaynie Stetzer, MD Norvel Richards, MD 06/19/2019 8:19:24 AM This report has been signed electronically. Number of Addenda: 0

## 2019-06-19 NOTE — Anesthesia Postprocedure Evaluation (Signed)
Anesthesia Post Note  Patient: Vanessa Bryant  Procedure(s) Performed: COLONOSCOPY WITH PROPOFOL (N/A ) ESOPHAGOGASTRODUODENOSCOPY (EGD) WITH PROPOFOL (N/A ) MALONEY DILATION POLYPECTOMY  Patient location during evaluation: PACU Anesthesia Type: General Level of consciousness: awake and alert Pain management: pain level controlled Vital Signs Assessment: post-procedure vital signs reviewed and stable Respiratory status: spontaneous breathing Cardiovascular status: stable Postop Assessment: no apparent nausea or vomiting Anesthetic complications: no     Last Vitals:  Vitals:   06/19/19 0639  BP: 121/86  Resp: 18  Temp: 36.9 C  SpO2: 100%    Last Pain:  Vitals:   06/19/19 0639  TempSrc: Oral  PainSc: 0-No pain                 Tyrez Berrios Hristova

## 2019-06-22 LAB — SURGICAL PATHOLOGY

## 2019-06-23 ENCOUNTER — Encounter: Payer: Self-pay | Admitting: Internal Medicine

## 2019-07-25 ENCOUNTER — Encounter: Payer: Self-pay | Admitting: Gastroenterology

## 2019-07-25 NOTE — Progress Notes (Signed)
Primary Care Physician:  Assunta Found, MD  Primary GI: Dr. Jena Gauss  Patient Location: Home   Provider Location: [facility] office   Reason for Visit: Follow-up    Persons present on the virtual encounter, with roles:  Vanessa Memos, PA-C (provider), Vanessa Bryant (patient)   Total time (minutes) spent on medical discussion: 15 minutes  Virtual Visit via Telephone Note Due to COVID-19, visit is conducted virtually and was requested by patient.   I connected with Vanessa Bryant on 07/27/19 at 10:30 AM EDT by telephone and verified that I am speaking with the correct person using two identifiers.   I discussed the limitations, risks, security and privacy concerns of performing an evaluation and management service by telephone and the availability of in person appointments. I also discussed with the patient that there may be a patient responsible charge related to this service. The patient expressed understanding and agreed to proceed.  Chief Complaint  Patient presents with  . Follow-up    fu from EGD/TCS     History of Present Illness: Vanessa Bryant is a 39 y.o. female presenting today for follow-up.  She was last seen in our office on 04/08/2019 at the time of the initial consult for abdominal pain.  Also with reported history of IDA, loose stools, and rectal bleeding.  She had cholecystectomy in 2014 that significantly improved abdominal pain but had return of epigastric pain shooting into her back for the last few years as well as epigastric burning/gnawing sensation and nausea. Symptoms worsened with meals. Overall, mild. Denied GERD symptoms.  Reported 3 episodes of tissue hematochezia in the last year which was new.  Also reported history of IDA with hemoglobin 8.5 last year s/p daily iron with most recent hemoglobin 14.17 March 2019.  Took ibuprofen a few times a month.  Also with postprandial diarrhea with associated abdominal cramping prior to BM to resolve thereafter) suspected to be  secondary to postcholecystectomy state and/or IBS, food intolerances. Abdominal exam benign. Plans included checking TSH, celiac serologies, follow lactose-free diet, check urea breath test, start Protonix after urea breath test was completed, proceed with TCS +/-EGD.  Requested to wait progress report regarding loose stools with plans to follow-up in office after procedures.   Labs completed 04/08/2019.  TSH normal.  No evidence of celiac disease.  H. pylori breath test negative.  She was advised to start Protonix 40 mg daily.  Progress report not received.  Procedures completed 06/19/2019: EGD: Normal esophagus dilated, single gastric polyp removed, exam otherwise normal. TCS: Entire examined colon appeared normal.  Anal canal hemorrhoids likely source of trivial anorectal bleeding.  Recommended repeat colonoscopy at age 48 for screening purposes.  Today:   Abdominal Pain: Burning has improved significantly since starting Protonix. Pain where her gallbladder used to be is intermittent.  Can go months without pain.  When it occurs, it is dull and feels irritated. Pinching or like someone has bruised her. Will last 1 to 1.5 days. Some radiation to her back at times. No history of kidney stones.  Wonders if clip left behind during surgery is causing any issues. No pain at this time. Last episode of pain was in January.  No associated nausea unless accidentally hitting the area during times of pain.  No other nausea.  No vomiting. No GERD symptoms. No dysphagia.   Loose Stools: Improved quite a bit. Feels Protonix is helping this as well. Tried lactose free diet for 2 weeks. No significant change in symptoms. She  wants to try lactaid pills. Having 2-3 stools a day. Stools are more formed. May get lose as the day goes on. Continues with abdominal cramping prior to a BM that resolves thereafter. Interested in trying Bentyl. No further blood in the stool.  No black stools.    Past Medical History:    Diagnosis Date  . Anxiety      Past Surgical History:  Procedure Laterality Date  . CHOLECYSTECTOMY N/A 02/16/2013   Procedure: LAPAROSCOPIC CHOLECYSTECTOMY WITH INTRAOPERATIVE CHOLANGIOGRAM;  Surgeon: Axel Filler, MD;  Location: [facility] OR;  Service: General;  Laterality: N/A;  . COLONOSCOPY WITH PROPOFOL N/A 06/19/2019   Procedure: COLONOSCOPY WITH PROPOFOL;  Surgeon: Corbin Ade, MD;  Entire examined colon appeared normal.  Anal canal hemorrhoids likely source of trivial anorectal bleeding.  Repeat at age 41 for screening purposes.  . ESOPHAGOGASTRODUODENOSCOPY (EGD) WITH PROPOFOL N/A 06/19/2019   Procedure: ESOPHAGOGASTRODUODENOSCOPY (EGD) WITH PROPOFOL;  Surgeon: Corbin Ade, MD;  Normal esophagus dilated, single gastric polyp removed, exam otherwise normal.  . MALONEY DILATION  06/19/2019   Procedure: MALONEY DILATION;  Surgeon: Corbin Ade, MD;  Location: [facility];  Service: Endoscopy;;  . POLYPECTOMY  06/19/2019   Procedure: POLYPECTOMY;  Surgeon: Corbin Ade, MD;  Location: [facility];  Service: Endoscopy;;  gastric  . WISDOM TOOTH EXTRACTION       Current Meds  Medication Sig  . pantoprazole (PROTONIX) 40 MG tablet Take 1 tablet (40 mg total) by mouth daily.     Family History  Problem Relation Age of Onset  . Heart disease Mother   . Hypertension Mother   . Hypertension Father   . Heart attack Maternal Grandmother   . Heart attack Maternal Grandfather   . Diabetes Paternal Grandfather   . Diabetes Paternal Grandmother   . Colon cancer Neg Hx   . Inflammatory bowel disease Neg Hx     Social History   Socioeconomic History  . Marital status: Married    Spouse name: Not on file  . Number of children: Not on file  . Years of education: Not on file  . Highest education level: Not on file  Occupational History  . Not on file  Tobacco Use  . Smoking status: Never Smoker  . Smokeless tobacco: Never Used  Substance and Sexual Activity  . Alcohol  use: No  . Drug use: No  . Sexual activity: Yes    Birth control/protection: Surgical    Comment: vasectomy   Other Topics Concern  . Not on file  Social History Narrative  . Not on file   Social Determinants of Health   Financial Resource Strain:   . Difficulty of Paying Living Expenses:   Food Insecurity:   . Worried About Programme researcher, broadcasting/film/video in the Last Year:   . Barista in the Last Year:   Transportation Needs:   . Freight forwarder (Medical):   Marland Kitchen Lack of Transportation (Non-Medical):   Physical Activity:   . Days of Exercise per Week:   . Minutes of Exercise per Session:   Stress:   . Feeling of Stress :   Social Connections:   . Frequency of Communication with Friends and Family:   . Frequency of Social Gatherings with Friends and Family:   . Attends Religious Services:   . Active Member of Clubs or Organizations:   . Attends Banker Meetings:   Marland Kitchen Marital Status:  Review of Systems: Gen: Denies fever, chills, lightheadedness, dizziness, presyncope, syncope, unintentional weight loss.   CV: Denies chest pain or palpitations, Resp: Denies shortness of breath or cough GI: see HPI Derm: Denies rash Heme: Denies bruising, bleeding Observations/Objective: No distress. Unable to perform physical exam due to telephone encounter. No video available.   Assessment and Plan: 39 year old female with history of cholecystectomy in 2014 presenting for follow-up of upper abdominal pain, loose stools, and rectal bleeding.   Abdominal pain: Patient reported epigastric burning/gnawing sensation, nausea, and intermittent epigastric/RUQ abdominal pain at her last visit in November 2020.  She is started on Protonix 40 mg daily and had EGD completed on 06/19/2019 that was completely normal.  Since starting Protonix, she feels epigastric burning has significantly improved.  No regular nausea.  She does continue with intermittent right upper quadrant pain  but may go months without pain.  No pain at this time.  When symptoms do occur, she feels they are very mild but similar to symptoms prior to cholecystectomy lasting 1-1.5 days. Query whether patient may be passing small stones through CBD.   As patient is without pain at this time, she was advised to continue to monitor for return of symptoms.  If symptoms return, she was advised to call our office and we will likely pursue RUQ ultrasound.  Otherwise, she will continue Protonix 40 mg daily 30 minutes before breakfast.   Loose stools: Improved since starting Protonix daily.  Reports trial of lactose-free diet for 2 weeks did not provide any improvement in symptoms.  Currently with 2-3 stools daily starting out formed but may get loose as the day goes on.  Continues with abdominal cramping prior to BMs that resolved thereafter.  Discussed trial of Bentyl to help with postprandial BMs and abdominal cramping.  Patient is interested in trying this.    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  Prescription for Bentyl 10 mg up to 3 times daily before meals sent to pharmacy.  Patient was advised to start with once daily dosing and increase as needed.  Counseled on side effects and advised to hold in the setting of constipation.  Rectal bleeding: Patient had 3 episodes of toilet tissue hematochezia in 2020 in the setting of rare constipation.  TCS completed for evaluation on 06/19/2019 with anal canal hemorrhoids as likely source of trivial anorectal bleeding.  Otherwise, entire examined colon appeared normal.  Recommendations to repeat colonoscopy at age 18 for screening purposes.  Follow Up Instructions: Follow-up in 6 months.   I discussed the assessment and treatment plan with the patient. The patient was provided an opportunity to ask questions and all were answered. The patient agreed with the plan and demonstrated an understanding of the  instructions.   The patient was advised to call back or seek an in-person evaluation if the symptoms worsen or if the condition fails to improve as anticipated.  I provided 15 minutes of non-face-to-face time during this encounter.  Aliene Altes, PA-C [facility]

## 2019-07-27 ENCOUNTER — Encounter: Payer: Self-pay | Admitting: Gastroenterology

## 2019-07-27 ENCOUNTER — Encounter: Payer: Self-pay | Admitting: Internal Medicine

## 2019-07-27 ENCOUNTER — Other Ambulatory Visit: Payer: Self-pay

## 2019-07-27 ENCOUNTER — Ambulatory Visit (INDEPENDENT_AMBULATORY_CARE_PROVIDER_SITE_OTHER): Payer: No Typology Code available for payment source | Admitting: Gastroenterology

## 2019-07-27 DIAGNOSIS — K625 Hemorrhage of anus and rectum: Secondary | ICD-10-CM

## 2019-07-27 DIAGNOSIS — R195 Other fecal abnormalities: Secondary | ICD-10-CM | POA: Diagnosis not present

## 2019-07-27 DIAGNOSIS — R1013 Epigastric pain: Secondary | ICD-10-CM | POA: Diagnosis not present

## 2019-07-27 MED ORDER — DICYCLOMINE HCL 10 MG PO CAPS: 10.0000 mg | ORAL_CAPSULE | Freq: Three times a day (TID) | ORAL | 3 refills | Status: DC

## 2019-07-27 NOTE — Patient Instructions (Signed)
Continue taking Protonix 40 mg daily 30 minutes before breakfast.  I will send in a prescription of Bentyl 10 mg for you to try to help with your loose stools and abdominal cramping after meals.  You may take this up to 3 times daily before meals and at bedtime as needed.  I recommend starting with once a day to see how this does for you.  Possible side effects of Bentyl include constipation, dry eyes, dry mouth, and dizziness.  Do not take Bentyl if you are constipated.  Continue to monitor for return of right upper quadrant abdominal pain.  Please let me know if this occurs and we can consider pursuing an ultrasound.  We will plan to follow-up with you in 6 months.  Call if you have questions or concerns prior.  Ermalinda Memos, PA-C [facility]

## 2019-07-27 NOTE — Progress Notes (Signed)
Cc'ed to pcp °

## 2019-09-30 ENCOUNTER — Other Ambulatory Visit: Payer: Self-pay | Admitting: Gastroenterology

## 2019-09-30 DIAGNOSIS — R1013 Epigastric pain: Secondary | ICD-10-CM

## 2020-01-27 ENCOUNTER — Ambulatory Visit: Payer: No Typology Code available for payment source | Admitting: Gastroenterology

## 2020-12-26 ENCOUNTER — Other Ambulatory Visit: Payer: Self-pay | Admitting: Obstetrics and Gynecology

## 2020-12-26 DIAGNOSIS — R928 Other abnormal and inconclusive findings on diagnostic imaging of breast: Secondary | ICD-10-CM

## 2021-01-04 ENCOUNTER — Other Ambulatory Visit: Payer: No Typology Code available for payment source

## 2021-04-24 NOTE — Progress Notes (Signed)
CARDIOLOGY CONSULT NOTE       Patient ID: Vanessa Bryant MRN: [id number] DOB/AGE: 1981-04-22 40 y.o.  Admit date: (Not on file) Referring Physician: Phillips Odor Primary Physician: Assunta Found, MD Primary Cardiologist: New Reason for Consultation: Palpitations  Active Problems:   * No active hospital problems. *   HPI:  40 y.o. referred by Dr Phillips Odor for palpitations History of anxiety Notices skipped beats mostly at night. Has seen GI repeatedly for abdominal pain with dilatation of esophagus and gastric polyp removal For years she has had flutering in her heart when laying down at night. 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   Doesn't think her palpitations are frequent enough for monitor as they cluster and she may not Have any for a few weeks. No excess caffeine Does not drink alcohol No stimulants  Concerned about her maternal history with mother and aunt having premature carotid and CAD   ROS All other systems reviewed and negative except as noted above  Past Medical History:  Diagnosis Date   Anxiety     Family History  Problem Relation Age of Onset   Heart disease Mother    Hypertension Mother    Hypertension Father    Heart attack Maternal Grandmother    Heart attack Maternal Grandfather    Diabetes Paternal Grandfather    Diabetes Paternal Grandmother    Colon cancer Neg Hx    Inflammatory bowel disease Neg Hx     Social History   Socioeconomic History   Marital status: Married    Spouse name: Not on file   Number of children: Not on file   Years of education: Not on file   Highest education level: Not on file  Occupational History   Not on file  Tobacco Use   Smoking status: Never   Smokeless tobacco: Never   Vaping Use   Vaping Use: Never used  Substance and Sexual Activity   Alcohol use: No   Drug use: No   Sexual activity: Yes    Birth control/protection: Surgical    Comment: vasectomy   Other Topics Concern   Not on file  Social History Narrative   Not on file   Social Determinants of Health   Financial Resource Strain: Not on file  Food Insecurity: Not on file  Transportation Needs: Not on file  Physical Activity: Not on file  Stress: Not on file  Social Connections: Not on file  Intimate Partner Violence: Not on file    Past Surgical History:  Procedure Laterality Date   CHOLECYSTECTOMY N/A 02/16/2013   Procedure: LAPAROSCOPIC CHOLECYSTECTOMY WITH INTRAOPERATIVE CHOLANGIOGRAM;  Surgeon: Axel Filler, MD;  Location: [facility] OR;  Service: General;  Laterality: N/A;   COLONOSCOPY WITH PROPOFOL N/A 06/19/2019   Procedure: COLONOSCOPY WITH PROPOFOL;  Surgeon: Corbin Ade, MD;  Entire examined colon appeared normal.  Anal canal hemorrhoids likely source of trivial anorectal bleeding.  Repeat at age 47 for screening purposes.   ESOPHAGOGASTRODUODENOSCOPY (EGD) WITH PROPOFOL N/A 06/19/2019   Procedure: ESOPHAGOGASTRODUODENOSCOPY (EGD) WITH PROPOFOL;  Surgeon: Corbin Ade, MD;  Normal esophagus dilated, single gastric polyp removed, exam otherwise normal.   MALONEY DILATION  06/19/2019   Procedure: MALONEY  DILATION;  Surgeon: Corbin Ade, MD;  [facility]: [facility];  Service: Endoscopy;;   POLYPECTOMY  06/19/2019   Procedure: POLYPECTOMY;  Surgeon: Corbin Ade, MD;  [facility]: [facility];  Service: Endoscopy;;  gastric   WISDOM TOOTH EXTRACTION        Current Outpatient Medications:    dicyclomine (BENTYL) 10 MG capsule, Take 1 capsule (10 mg total) by mouth 3 (three) times daily before meals. (Patient not taking: Reported on 05/01/2021), Disp: 90 capsule, Rfl: 3   pantoprazole (PROTONIX) 40 MG tablet, TAKE 1 TABLET(40 MG) BY MOUTH DAILY (Patient not taking: Reported on  05/01/2021), Disp: 30 tablet, Rfl: 5    Physical Exam: Blood pressure 118/80, pulse 76, height 5\' 2"  (1.575 m), weight 115 lb 12.8 oz (52.5 kg), SpO2 100 %.    Affect appropriate Healthy:  appears stated age HEENT: normal Neck supple with no adenopathy JVP normal no bruits no thyromegaly Lungs clear with no wheezing and good diaphragmatic motion Heart:  S1/S2 no murmur, no rub, gallop or click PMI normal Abdomen: benighn, BS positve, no tenderness, no AAA no bruit.  No HSM or HJR Distal pulses intact with no bruits No edema Neuro non-focal Skin warm and dry No muscular weakness   Labs:   Lab Results  Component Value Date   WBC 7.4 06/17/2019   HGB 14.2 06/17/2019   HCT 44.3 06/17/2019   MCV 87.9 06/17/2019   PLT 209 06/17/2019   No results for input(s): NA, K, CL, CO2, BUN, CREATININE, CALCIUM, PROT, BILITOT, ALKPHOS, ALT, AST, GLUCOSE in the last 168 hours.  Invalid input(s): LABALBU No results found for: CKTOTAL, CKMB, CKMBINDEX, TROPONINI No results found for: CHOL No results found for: HDL No results found for: LDLCALC No results found for: TRIG No results found for: CHOLHDL No results found for: LDLDIRECT    Radiology: No results found.  EKG: NSR ICRBBB normal    ASSESSMENT AND PLAN:   Palpitations:  benign sounding mostly at night Too infrequent for monitor F/U ETT and echo r/o structural heart dx GI:  f/u Dr 08/15/2019  post EGD and GB removal Protonix Would not use Bentyl as anticholinergic and cause rapid palpitations  Family History CAD:  calcium score to risk stratify   TTE ETT Calcium score  F/U PRN   Signed: Kendell Bane 05/01/2021, 3:04 PM

## 2021-05-01 ENCOUNTER — Ambulatory Visit (INDEPENDENT_AMBULATORY_CARE_PROVIDER_SITE_OTHER): Payer: No Typology Code available for payment source | Admitting: Cardiovascular Disease

## 2021-05-01 ENCOUNTER — Encounter: Payer: Self-pay | Admitting: Cardiovascular Disease

## 2021-05-01 ENCOUNTER — Other Ambulatory Visit: Payer: Self-pay

## 2021-05-01 VITALS — BP 118/80 | HR 76 | Ht 62.0 in | Wt 115.8 lb

## 2021-05-01 DIAGNOSIS — R002 Palpitations: Secondary | ICD-10-CM

## 2021-05-01 DIAGNOSIS — Z8249 Family history of ischemic heart disease and other diseases of the circulatory system: Secondary | ICD-10-CM | POA: Diagnosis not present

## 2021-05-01 NOTE — Patient Instructions (Signed)
Medication Instructions:  Your physician recommends that you continue on your current medications as directed. Please refer to the Current Medication list given to you today.  *If you need a refill on your cardiac medications before your next appointment, please call your pharmacy*   Lab Work: NONE   If you have labs (blood work) drawn today and your tests are completely normal, you will receive your results only by: MyChart Message (if you have MyChart) OR A paper copy in the mail If you have any lab test that is abnormal or we need to change your treatment, we will call you to review the results.   Testing/Procedures: Your physician has requested that you have an exercise tolerance test. For further information please visit [email or web address] Please also follow instruction sheet, as given.  Your physician has requested that you have an echocardiogram. Echocardiography is a painless test that uses sound waves to create images of your heart. It provides your doctor with information about the size and shape of your heart and how well your hearts chambers and valves are working. This procedure takes approximately one hour. There are no restrictions for this procedure.  Calcium Score    Follow-Up: At [facility], you and your health needs are our priority.  As part of our continuing mission to provide you with exceptional heart care, we have created designated Provider Care Teams.  These Care Teams include your primary Cardiologist (physician) and Advanced Practice Providers (APPs -  Physician Assistants and Nurse Practitioners) who all work together to provide you with the care you need, when you need it.  We recommend signing up for the patient portal called "MyChart".  Sign up information is provided on this After Visit Summary.  MyChart is used to connect with patients for Virtual Visits (Telemedicine).  Patients are able to view lab/test results, encounter notes, upcoming  appointments, etc.  Non-urgent messages can be sent to your provider as well.   To learn more about what you can do with MyChart, go to [email or web address].    Your next appointment:    As Needed   The format for your next appointment:   In Person  Provider:   Charlton Haws, MD    Other Instructions Thank you for choosing [facility]!

## 2021-05-17 ENCOUNTER — Encounter (HOSPITAL_COMMUNITY): Payer: No Typology Code available for payment source

## 2021-06-07 ENCOUNTER — Ambulatory Visit (HOSPITAL_COMMUNITY): Payer: No Typology Code available for payment source

## 2021-06-07 ENCOUNTER — Encounter (HOSPITAL_COMMUNITY): Payer: No Typology Code available for payment source

## 2021-06-07 ENCOUNTER — Other Ambulatory Visit (HOSPITAL_COMMUNITY): Payer: No Typology Code available for payment source

## 2021-07-12 ENCOUNTER — Encounter (HOSPITAL_COMMUNITY): Payer: Self-pay

## 2023-11-28 ENCOUNTER — Ambulatory Visit (INDEPENDENT_AMBULATORY_CARE_PROVIDER_SITE_OTHER): Admitting: Family Medicine

## 2023-11-28 ENCOUNTER — Encounter: Payer: Self-pay | Admitting: Family Medicine

## 2023-11-28 VITALS — BP 114/82 | Ht 63.0 in | Wt 131.0 lb

## 2023-11-28 DIAGNOSIS — M7502 Adhesive capsulitis of left shoulder: Secondary | ICD-10-CM | POA: Diagnosis not present

## 2023-11-28 MED ORDER — PREDNISONE 10 MG PO TABS: ORAL_TABLET | ORAL | 0 refills | Status: AC

## 2023-11-28 NOTE — Progress Notes (Addendum)
 Vanessa Bryant - 43 y.o. female MRN [id number]  Date of birth: [birth year]  Office Visit Note: Visit Date: 11/28/2023 PCP: No primary care provider on file. Referred by: Marvine Rush, MD  Subjective: L shoulder pain  HPI: Vanessa Bryant is a pleasant 43 y.o. female who presents today for evaluation of left shoulder pain.  Says she initially injured her left shoulder while playing volleyball with her daughter on a camping trip in April of this year.  Since then she has had pain in the left shoulder radiating to the lateral head of the deltoid.  She has difficulty with activities such as shampooing her hair, putting on clothes, unable to scratch her back, and over the last 2 weeks has noticed a decreased range of motion.  Also has extreme difficulty sleeping due to aching pain that keeps her up at night.  She is taken over-the-counter ibuprofen  for pain relief which helps somewhat but does not resolve issue.  She has not tried any home exercises up to this point.  She is not seeking prior medical advice due to having a high deductible plan.   Pertinent ROS were reviewed with the patient and found to be negative unless otherwise specified above in HPI.   Assessment & Plan: Visit Diagnoses:  1. Adhesive capsulitis of left shoulder     Plan: Initial injury in April likely caused subacromial bursitis versus rotator cuff tendinitis.  Over the next couple months lack of active mobility and attempts to decrease pain has brought on what is now a moderate/severe case of frozen shoulder with almost complete loss of external rotation.  On physical exam testing she does not have any intrinsic rotator cuff weakness and I do not suspect any full-thickness tears.  PLAN: - Offered treatment modalities including intra-articular glucocorticoid injection however patient does not wish to receive injection at this time.   - Elected to instead give her 12-day prednisone  taper starting at 60 mg and decreasing by 10  mg every 2 days.    You should take in the morning and with food. - After completing your steroids you can take Ibuprofen  200mg  3 tabs by mouth every 8 hours as needed.  Take with food.  Do not take while on the steroids - Can use Tylenol  Extra-strength 500mg  1-2 tabs every 6-hours as needed, max 8-tabs/day. - Limit lifting and overhead activities as much as possible. - Heat 15 minutes at a time 3-4 times a day may help with movement and stiffness. - Motion exercises (pendulum, wall walking or table slides, arm circles) - do 3 sets of 10 once or twice a day. - Physical therapy will be helpful.  We placed a referral to do 1-2 sessions for instruction on a home exercise program, was not interested in complete formal PT due to high deductible insurance plan - Follow up in 4-6 weeks.  We may consider imaging and/or cortisone injection if not improving.  Follow-up: No follow-ups on file.   Meds & Orders:  Meds ordered this encounter  Medications   predniSONE  (DELTASONE ) 10 MG tablet    Sig: Use as directed per doctors orders for the next 12 days.    Dispense:  48 tablet    Refill:  0    Orders Placed This Encounter  Procedures   Ambulatory referral to Physical Therapy     Procedures: No procedures performed      Clinical History: No specialty comments available.  She reports that she has never smoked. She  has never used smokeless tobacco. No results for input(s): HGBA1C, LABURIC in the last 8760 hours.  Objective:   Vital Signs: BP 114/82   Ht 5' 3 (1.6 m)   Wt 131 lb (59.4 kg)   BMI 23.21 kg/m   Physical Exam  Gen: Well-appearing, in no acute distress; non-toxic CV: Well-perfused. Warm.  Resp: Breathing unlabored on room air; no wheezing. Psych: Fluid speech in conversation; appropriate affect; normal thought process  Ortho Exam Shoulder, Left:  No  skin changes, erythema, or ecchymosis noted. No evidence of bony deformity, asymmetry, or muscle atrophy; No tenderness  over long head of biceps (bicipital groove). No TTP at [facility] joint. Limited active and passive range of motion (100 flex Camelia /80Abd /10ER /70IR), Strength 5/5 throughout.  No abnormal scapular function observed, however she does compensate lack of shoulder abduction with increased scapular motion.. Sensation to light touch intact. Peripheral pulses intact. DTR's wnl.   Special Tests:   - Empty can: NEG   - Int/Ext Rotation test: Positive, extremity limited external rotation of only 10 degrees.  No intrinsic weakness present on resisted internal or external rotation.   [place] [employer] Lift-Off Test: Unable to perform   - Hawkins: NEG   - Neer test: NEG   - O'brien's test: NEG   - Yergason's: NEG   - Speeds test: NEG   Imaging: No results found.  Past Medical/Family/Surgical/Social History: Medications & Allergies reviewed per EMR, new medications updated. Patient Active Problem List   Diagnosis Date Noted   Iron deficiency anemia 04/08/2019   Abdominal pain, epigastric 04/08/2019   Loose stools 04/08/2019   Rectal bleeding 04/08/2019   Ureteral calculus 12/02/2012   Past Medical History:  Diagnosis Date   Anxiety    Family History  Problem Relation Age of Onset   Heart disease Mother    Hypertension Mother    Hypertension Father    Heart attack Maternal Grandmother    Heart attack Maternal Grandfather    Diabetes Paternal Grandfather    Diabetes Paternal Grandmother    Colon cancer Neg Hx    Inflammatory bowel disease Neg Hx    Past Surgical History:  Procedure Laterality Date   CHOLECYSTECTOMY N/A 02/16/2013   Procedure: LAPAROSCOPIC CHOLECYSTECTOMY WITH INTRAOPERATIVE CHOLANGIOGRAM;  Surgeon: Lynda Leos, MD;  Location: [facility] OR;  Service: General;  Laterality: N/A;   COLONOSCOPY WITH PROPOFOL  N/A 06/19/2019   Procedure: COLONOSCOPY WITH PROPOFOL ;  Surgeon: Shaaron Lamar HERO, MD;  Entire examined colon appeared normal.  Anal canal hemorrhoids likely source of trivial  anorectal bleeding.  Repeat at age 71 for screening purposes.   ESOPHAGOGASTRODUODENOSCOPY (EGD) WITH PROPOFOL  N/A 06/19/2019   Procedure: ESOPHAGOGASTRODUODENOSCOPY (EGD) WITH PROPOFOL ;  Surgeon: Shaaron Lamar HERO, MD;  Normal esophagus dilated, single gastric polyp removed, exam otherwise normal.   MALONEY DILATION  06/19/2019   Procedure: MALONEY DILATION;  Surgeon: Shaaron Lamar HERO, MD;  [facility];  Service: Endoscopy;;   POLYPECTOMY  06/19/2019   Procedure: POLYPECTOMY;  Surgeon: Shaaron Lamar HERO, MD;  [facility];  Service: Endoscopy;;  gastric   WISDOM TOOTH EXTRACTION     Social History   Occupational History   Not on file  Tobacco Use   Smoking status: Never   Smokeless tobacco: Never  Vaping Use   Vaping status: Never Used  Substance and Sexual Activity   Alcohol use: No   Drug use: No   Sexual activity: Yes    Birth control/protection: Surgical  Comment: vasectomy

## 2023-11-28 NOTE — Patient Instructions (Signed)
 You have a frozen shoulder (adhesive capsulitis), a buildup of scar tissue that limits motion of the shoulder joint.  Limit lifting and overhead activities as much as possible. Heat 15 minutes at a time 3-4 times a day may help with movement and stiffness. We sent a prescription for a 12-day steroid taper (Prednisone ) to the pharmacy for you.  You should take in the morning and with food. After completing your steroids you can take Ibuprofen  200mg  3 tabs by mouth every 8 hours as needed.  Take with food.  Do not take while on the steroids Can use Tylenol  Extra-strength 500mg  1-2 tabs every 6-hours as needed, max 8-tabs/day. Steroid injections in a series have been shown to help with pain and motion. Motion exercises (pendulum, wall walking or table slides, arm circles) - do 3 sets of 10 once or twice a day. Physical therapy will be helpful.  We placed a referral to do 1-2 sessions for instruction on a home exercise program Follow up in 4-6 weeks.  We may consider imaging if not improving.

## 2023-12-10 ENCOUNTER — Ambulatory Visit: Attending: Family Medicine | Admitting: Physical Therapy

## 2023-12-10 ENCOUNTER — Encounter: Payer: Self-pay | Admitting: Physical Therapy

## 2023-12-10 DIAGNOSIS — M25612 Stiffness of left shoulder, not elsewhere classified: Secondary | ICD-10-CM | POA: Diagnosis present

## 2023-12-10 DIAGNOSIS — M7502 Adhesive capsulitis of left shoulder: Secondary | ICD-10-CM | POA: Insufficient documentation

## 2023-12-10 DIAGNOSIS — M25512 Pain in left shoulder: Secondary | ICD-10-CM | POA: Diagnosis present

## 2023-12-10 NOTE — Therapy (Signed)
 OUTPATIENT PHYSICAL THERAPY SHOULDER EVALUATION   Patient Name: Vanessa Bryant MRN: [id number] DOB:May 07, 1981, 43 y.o., female Today's Date: 12/10/2023  END OF SESSION:  PT End of Session - 12/10/23 1144     Visit Number 1    Number of Visits 2    Date for PT Re-Evaluation 12/31/23    PT Start Time 0800    PT Stop Time 0843    PT Time Calculation (min) 43 min    Activity Tolerance Patient tolerated treatment well    Behavior During Therapy [facility] for tasks assessed/performed          Past Medical History:  Diagnosis Date   Anxiety    Past Surgical History:  Procedure Laterality Date   CHOLECYSTECTOMY N/A 02/16/2013   Procedure: LAPAROSCOPIC CHOLECYSTECTOMY WITH INTRAOPERATIVE CHOLANGIOGRAM;  Surgeon: Lynda Leos, MD;  Location: [facility] OR;  Service: General;  Laterality: N/A;   COLONOSCOPY WITH PROPOFOL  N/A 06/19/2019   Procedure: COLONOSCOPY WITH PROPOFOL ;  Surgeon: Shaaron Lamar HERO, MD;  Entire examined colon appeared normal.  Anal canal hemorrhoids likely source of trivial anorectal bleeding.  Repeat at age 18 for screening purposes.   ESOPHAGOGASTRODUODENOSCOPY (EGD) WITH PROPOFOL  N/A 06/19/2019   Procedure: ESOPHAGOGASTRODUODENOSCOPY (EGD) WITH PROPOFOL ;  Surgeon: Shaaron Lamar HERO, MD;  Normal esophagus dilated, single gastric polyp removed, exam otherwise normal.   MALONEY DILATION  06/19/2019   Procedure: MALONEY DILATION;  Surgeon: Shaaron Lamar HERO, MD;  [facility];  Service: Endoscopy;;   POLYPECTOMY  06/19/2019   Procedure: POLYPECTOMY;  Surgeon: Shaaron Lamar HERO, MD;  [facility];  Service: Endoscopy;;  gastric   WISDOM TOOTH EXTRACTION     Patient Active Problem List   Diagnosis Date Noted   Iron deficiency anemia 04/08/2019   Abdominal pain, epigastric 04/08/2019   Loose stools 04/08/2019   Rectal bleeding 04/08/2019   Ureteral calculus 12/02/2012   REFERRING PROVIDER: Rainell Cedar MD  REFERRING DIAG: Adhesive capsulitis of left shoulder.     THERAPY DIAG:  Acute pain of left shoulder - Plan: PT plan of care cert/re-cert  Stiffness of left shoulder, not elsewhere classified - Plan: PT plan of care cert/re-cert  Rationale for Evaluation and Treatment: Rehabilitation  ONSET DATE: April 2025.  SUBJECTIVE:                                                                                                                                                                                      SUBJECTIVE STATEMENT: The patient presents to the clinic with c/o left shoulder pain happening after playing volleyball in April of this year and then aggravation of symptoms when getting into a pool.  Her pain at rest is a 2/10 but higher with movement.  Rest and medication decrease pain.  Sudden movements and certain positions increase pain.  She has been doing the pendulum, wall walks and table slides.    PERTINENT HISTORY: Unremarkable.    PAIN:  Are you having pain? Yes: NPRS scale: 2/10. Pain location: Left shoulder. Pain description: Ache and sore.   Aggravating factors: As above.   Relieving factors: As above.    PRECAUTIONS: None  RED FLAGS: None   WEIGHT BEARING RESTRICTIONS: No  FALLS:  Has patient fallen in last 6 months? No  LIVING ENVIRONMENT: Lives with: lives with their family and lives with their spouse Lives in: House/apartment Has following equipment at home: None  OCCUPATION: Armed forces operational officer  PLOF: Independent  PATIENT GOALS:Get back to normal.  Use left shoulder without pain.    NEXT MD VISIT:   OBJECTIVE:  Note: Objective measures were completed at Evaluation unless otherwise noted.  UPPER EXTREMITY ROM:   Active right shoulder flexion to 95 degrees and passive to 105 degrees.  ER is 15 degrees, abduction to 60 degrees and behind back to L5-S1.    UPPER EXTREMITY MMT:  Normal left shoulder strength.  PALPATION:  Pain reported in middle deltoid region which appears to be referred in nature.     DTR'S:            Normal UE DTR's.                                                                                                                             TREATMENT DATE: UBE at 90 RPM's x 4 minutes f/b pulleys x 6 minutes.  Instruct in and performance of supine cane exercise into flexion and ER and countertop stretch to improve flexion f/b gentle LLLDS 1-1 stretching into flexion and ER (6 minutes).      PATIENT EDUCATION: Education details: Information provided for TENS unit and home pulley system.  Supine cane exercises, countertop stretch, alternate wall climb exercise and seated ER using a cane.   Person educated: Patient Education method: Explanation, Demonstration, Tactile cues, Verbal cues, and Handouts Education comprehension: verbalized understanding, returned demonstration, verbal cues required, and tactile cues required  HOME EXERCISE PROGRAM: CANE CANE  [[id number]]  Seated Wand UE External Rotation -  Repeat 10 Repetitions, Hold 10 Seconds, Complete 2 Sets, Perform 3 Times a Day  Closed chain flexion stretch -  Repeat 10 Repetitions, Hold 10 Seconds, Complete 2 Sets, Perform 4 Times a Day  WAND EXTERNAL ROTATION - SUPINE ER -  Repeat 10 Repetitions, Hold 10 Seconds, Complete 2 Sets, Perform 4 Times a Day  ASSESSMENT:  CLINICAL IMPRESSION: The patient presents to [facility]PPT with c/o left shoulder pain and loss of motion since April of this year since playing volleyball.  She has a significant loss of motion.  Her strength is normal as are her UE DTR's. She has c/o pain into her middle deltoid  which appears to be referred in nature.  Her Quick DASH score is 31.81.  She was instructed in multiple home exercises and tolerated gentle passive range of motion well.  She is very motivated to improve.  She plans to return next week for advancing her HEP and likely d/c at this time.     OBJECTIVE IMPAIRMENTS: decreased activity tolerance, decreased ROM, and pain.   ACTIVITY  LIMITATIONS: carrying, lifting, and reach over head  PARTICIPATION LIMITATIONS: meal prep, cleaning, laundry, and yard work  PERSONAL FACTORS: Time since onset of injury/illness/exacerbation are also affecting patient's functional outcome.   REHAB POTENTIAL: Good  CLINICAL DECISION MAKING: Stable/uncomplicated  EVALUATION COMPLEXITY: Low   GOALS:   SHORT TERM GOALS: Target date: 12/24/23  Ind with a HEP. Goal status: INITIAL  PLAN:  PT FREQUENCY: 2 visits.  PT DURATION: 2 weeks  PLANNED INTERVENTIONS: 97110-Therapeutic exercises, 97530- Therapeutic activity, [id number]- Neuromuscular re-education, 97535- Self Care, 02859- Manual therapy, G0283- Electrical stimulation (unattended), 97035- Ultrasound, Patient/Family education, and Moist heat  PLAN FOR NEXT SESSION: Review HEP, add gentle towel stretch, add ER and flexion stretch exercises to HEP.   Makena Mcgrady, [place], PT 12/10/2023, 12:18 PM

## 2023-12-20 ENCOUNTER — Ambulatory Visit: Attending: Family Medicine | Admitting: *Deleted

## 2023-12-20 ENCOUNTER — Encounter: Payer: Self-pay | Admitting: *Deleted

## 2023-12-20 DIAGNOSIS — M25512 Pain in left shoulder: Secondary | ICD-10-CM | POA: Diagnosis present

## 2023-12-20 DIAGNOSIS — M25612 Stiffness of left shoulder, not elsewhere classified: Secondary | ICD-10-CM | POA: Diagnosis present

## 2023-12-20 NOTE — Therapy (Addendum)
 OUTPATIENT PHYSICAL THERAPY SHOULDER TREATMENT   Patient Name: Vanessa Bryant MRN: [id number] DOB:[birth year], 43 y.o., female Today's Date: 12/20/2023  END OF SESSION:  PT End of Session - 12/20/23 0805     Visit Number 2    Number of Visits 2    Date for PT Re-Evaluation 12/31/23    PT Start Time 0800    PT Stop Time 0847    PT Time Calculation (min) 47 min          Past Medical History:  Diagnosis Date   Anxiety    Past Surgical History:  Procedure Laterality Date   CHOLECYSTECTOMY N/A 02/16/2013   Procedure: LAPAROSCOPIC CHOLECYSTECTOMY WITH INTRAOPERATIVE CHOLANGIOGRAM;  Surgeon: Lynda Leos, MD;  Location: [facility] OR;  Service: General;  Laterality: N/A;   COLONOSCOPY WITH PROPOFOL  N/A 06/19/2019   Procedure: COLONOSCOPY WITH PROPOFOL ;  Surgeon: Shaaron Lamar HERO, MD;  Entire examined colon appeared normal.  Anal canal hemorrhoids likely source of trivial anorectal bleeding.  Repeat at age 61 for screening purposes.   ESOPHAGOGASTRODUODENOSCOPY (EGD) WITH PROPOFOL  N/A 06/19/2019   Procedure: ESOPHAGOGASTRODUODENOSCOPY (EGD) WITH PROPOFOL ;  Surgeon: Shaaron Lamar HERO, MD;  Normal esophagus dilated, single gastric polyp removed, exam otherwise normal.   MALONEY DILATION  06/19/2019   Procedure: MALONEY DILATION;  Surgeon: Shaaron Lamar HERO, MD;  [facility];  Service: Endoscopy;;   POLYPECTOMY  06/19/2019   Procedure: POLYPECTOMY;  Surgeon: Shaaron Lamar HERO, MD;  [facility];  Service: Endoscopy;;  gastric   WISDOM TOOTH EXTRACTION     Patient Active Problem List   Diagnosis Date Noted   Iron deficiency anemia 04/08/2019   Abdominal pain, epigastric 04/08/2019   Loose stools 04/08/2019   Rectal bleeding 04/08/2019   Ureteral calculus 12/02/2012   REFERRING PROVIDER: Rainell Cedar MD  REFERRING DIAG: Adhesive capsulitis of left shoulder.    THERAPY DIAG:  Acute pain of left shoulder  Stiffness of left shoulder, not elsewhere classified  Rationale for  Evaluation and Treatment: Rehabilitation  ONSET DATE: April 2025.  SUBJECTIVE:                                                                                                                                                                                      SUBJECTIVE STATEMENT: The patient presents to the clinic with c/o left shoulder pain happening after playing volleyball in April of this year. Doing HEP. DC after today with complete HEP    PERTINENT HISTORY: Unremarkable.    PAIN:  Are you having pain? Yes: NPRS scale: 2/10. Pain location: Left shoulder. Pain description: Ache and sore.   Aggravating factors: As above.  Relieving factors: As above.    PRECAUTIONS: None  RED FLAGS: None   WEIGHT BEARING RESTRICTIONS: No  FALLS:  Has patient fallen in last 6 months? No  LIVING ENVIRONMENT: Lives with: lives with their family and lives with their spouse Lives in: House/apartment Has following equipment at home: None  OCCUPATION: Armed forces operational officer  PLOF: Independent  PATIENT GOALS:Get back to normal.  Use left shoulder without pain.    NEXT MD VISIT:   OBJECTIVE:  Note: Objective measures were completed at Evaluation unless otherwise noted.  UPPER EXTREMITY ROM:   Active right shoulder flexion to 95 degrees and passive to 105 degrees.  ER is 15 degrees, abduction to 60 degrees and behind back to L5-S1.    UPPER EXTREMITY MMT:  Normal left shoulder strength.  PALPATION:  Pain reported in middle deltoid region which appears to be referred in nature.    DTR'S:            Normal UE DTR's.                                                                                                                             TREATMENT DATE: 12-20-23  UBE at 90 RPM's x 6 minutes  Review of HEP    Review of supine cane exercise into flexion and ER and countertop stretch to improve flexion  gentle LLLDS 1-1 stretching into flexion and ER   Progression of HEP Standing  Shoulder Internal Rotation Stretch - Thumb Down Sets 1 3 Reps Standing Shoulder External Rotation Stretch in Doorway 1 Sets 3 Reps Shoulder ER Stretch in Abduction x Daily 1 Sets 10 Reps Standing Shoulder Internal Rotation Stretch with Towel 1 Sets 3 Reps Standing Shoulder Posterior Capsule Stretch x Daily 1 Sets 3 Reps Seated Shoulder Flexion AAROM with Pulley Behind 1 Sets 3 Reps  PATIENT EDUCATION: Education details: Information provided for TENS unit and home pulley system.  Supine cane exercises, countertop stretch, alternate wall climb exercise and seated ER using a cane.   Person educated: Patient Education method: Explanation, Demonstration, Tactile cues, Verbal cues, and Handouts Education comprehension: verbalized understanding, returned demonstration, verbal cues required, and tactile cues required  HOME EXERCISE PROGRAM: CANE CANE  [[id number]]  Seated Wand UE External Rotation -  Repeat 10 Repetitions, Hold 10 Seconds, Complete 2 Sets, Perform 3 Times a Day  Closed chain flexion stretch -  Repeat 10 Repetitions, Hold 10 Seconds, Complete 2 Sets, Perform 4 Times a Day  WAND EXTERNAL ROTATION - SUPINE ER -  Repeat 10 Repetitions, Hold 10 Seconds, Complete 2 Sets, Perform 4 Times a Day  ASSESSMENT:  CLINICAL IMPRESSION: The patient ariived today doing well. Rx focused on HEP ROM stretches all motions as well as ER/IR red band . Handout and red tband given to patient for HEP. STG met. DC to HEP  OBJECTIVE IMPAIRMENTS: decreased activity tolerance, decreased ROM, and pain.   ACTIVITY LIMITATIONS: carrying, lifting, and reach over head  PARTICIPATION LIMITATIONS: meal prep, cleaning, laundry, and yard work  PERSONAL FACTORS: Time since onset of injury/illness/exacerbation are also affecting patient's functional outcome.   REHAB POTENTIAL: Good  CLINICAL DECISION MAKING: Stable/uncomplicated  EVALUATION COMPLEXITY: Low   GOALS:   SHORT TERM  GOALS: Target date: 12/24/23  Ind with a HEP. Goal status: MET  PLAN:  PT FREQUENCY: 2 visits.  PT DURATION: 2 weeks  PLANNED INTERVENTIONS: 97110-Therapeutic exercises, 97530- Therapeutic activity, [id number]- Neuromuscular re-education, 97535- Self Care, 02859- Manual therapy, G0283- Electrical stimulation (unattended), 97035- Ultrasound, Patient/Family education, and Moist heat  PLAN :   DC to HEP.   Saleem Coccia,CHRIS, PTA 12/20/2023, 9:21 AM

## 2023-12-26 ENCOUNTER — Ambulatory Visit (INDEPENDENT_AMBULATORY_CARE_PROVIDER_SITE_OTHER): Admitting: Family Medicine

## 2023-12-26 ENCOUNTER — Encounter: Payer: Self-pay | Admitting: Family Medicine

## 2023-12-26 VITALS — BP 104/78 | Ht 63.0 in | Wt 131.0 lb

## 2023-12-26 DIAGNOSIS — M7502 Adhesive capsulitis of left shoulder: Secondary | ICD-10-CM | POA: Diagnosis not present

## 2023-12-26 NOTE — Progress Notes (Signed)
 DATE OF VISIT: 12/26/2023        Vanessa Bryant DOB: 1980-10-16 MRN: [id number]  CC:  f/u Lt frozen shoulder  History of present Illness: Vanessa Bryant is a 43 y.o. female who presents for a follow-up visit for frozen shoulder Last seen by us  11/28/2023 - At that visit was prescribed prednisone  taper, and referred to PT for 1-2 sessions for evaluation instruction on home exercise program She reports was only able to tolerate the prednisone  for approximately 5 days.  She is having associated headaches, stomach irritation, sleep dysfunction.  She is not sure if this was helpful She did complete 2 sessions of physical therapy, has been doing her home exercises typically twice a day She is no longer having pain, feels that range of motion is slightly improved Overall feels approximate 40% improved from last visit.  Medications:  Outpatient Encounter Medications as of 12/26/2023  Medication Sig   predniSONE  (DELTASONE ) 10 MG tablet Use as directed per doctors orders for the next 12 days.   No facility-administered encounter medications on file as of 12/26/2023.    Allergies: is allergic to penicillins.  Physical Examination: Vitals: BP 104/78   Ht 5' 3 (1.6 m)   Wt 131 lb (59.4 kg)   BMI 23.21 kg/m  GENERAL:  Vanessa Bryant is a 43 y.o. female appearing their stated age, alert and oriented x 3, in no apparent distress.  SKIN: no rashes or lesions, skin clean, dry, intact MSK: Left shoulder without gross deformity.  No tenderness to palpation over the bicipital groove, AC joint, greater tuberosity.  Limited range of motion in all planes, limited by approximately 50% both actively and passively.  Rotator cuff strength is 5/5 throughout.  Normal grip strength bilaterally Neurovascular intact distally  Assessment & Plan Adhesive capsulitis of left shoulder Left frozen shoulder, now with decreased pain, slight improvement in range of motion.  Patient feeling approxi-40% improved  overall  Plan: - Should continue with her home exercises at least twice a day as she is doing - Over-the-counter Tylenol  or NSAIDs as needed - Heat or ice as needed - Discussed role of possible cortisone injection to help with symptoms, she is not interested at this time - Should follow-up with us  in 6 to 8 weeks for reevaluation, sooner as needed.  Could consider injection or imaging if symptoms are worsening or not improving   Patient expressed understanding & agreement with above.  Encounter Diagnosis  Name Primary?   Adhesive capsulitis of left shoulder Yes    No orders of the defined types were placed in this encounter.
